# Patient Record
Sex: Male | Born: 1960 | Race: White | Hispanic: No | Marital: Married | Smoking: Never smoker
Health system: Southern US, Community
[De-identification: ages and names within clinical notes are randomized; demographics above are authoritative.]

## PROBLEM LIST (undated history)

## (undated) DIAGNOSIS — F329 Major depressive disorder, single episode, unspecified: Secondary | ICD-10-CM

## (undated) DIAGNOSIS — J45909 Unspecified asthma, uncomplicated: Secondary | ICD-10-CM

## (undated) DIAGNOSIS — K5792 Diverticulitis of intestine, part unspecified, without perforation or abscess without bleeding: Secondary | ICD-10-CM

## (undated) DIAGNOSIS — C44310 Basal cell carcinoma of skin of unspecified parts of face: Secondary | ICD-10-CM

## (undated) DIAGNOSIS — K219 Gastro-esophageal reflux disease without esophagitis: Secondary | ICD-10-CM

## (undated) DIAGNOSIS — F32A Depression, unspecified: Secondary | ICD-10-CM

## (undated) DIAGNOSIS — N400 Enlarged prostate without lower urinary tract symptoms: Secondary | ICD-10-CM

## (undated) DIAGNOSIS — F5102 Adjustment insomnia: Secondary | ICD-10-CM

## (undated) HISTORY — PX: CATARACT EXTRACTION, BILATERAL: SHX1313

## (undated) HISTORY — PX: SHOULDER SURGERY: SHX246

## (undated) HISTORY — DX: Basal cell carcinoma of skin of unspecified parts of face: C44.310

## (undated) HISTORY — DX: Depression, unspecified: F32.A

## (undated) HISTORY — DX: Adjustment insomnia: F51.02

## (undated) HISTORY — DX: Unspecified asthma, uncomplicated: J45.909

## (undated) HISTORY — PX: ABDOMINAL SURGERY: SHX537

---
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
## 2006-10-19 ENCOUNTER — Ambulatory Visit: Payer: Self-pay | Admitting: Internal Medicine

## 2006-10-19 LAB — CONVERTED CEMR LAB
ALT: 25 units/L (ref 0–40)
Albumin: 4 g/dL (ref 3.5–5.2)
Alkaline Phosphatase: 48 units/L (ref 39–117)
BUN: 10 mg/dL (ref 6–23)
Basophils Relative: 0.4 % (ref 0.0–1.0)
Bilirubin Urine: NEGATIVE
CO2: 33 meq/L — ABNORMAL HIGH (ref 19–32)
Calcium: 9.3 mg/dL (ref 8.4–10.5)
Eosinophils Absolute: 0.2 10*3/uL (ref 0.0–0.6)
GFR calc Af Amer: 104 mL/min
GFR calc non Af Amer: 86 mL/min
Lymphocytes Relative: 36.1 % (ref 12.0–46.0)
Monocytes Relative: 7.5 % (ref 3.0–11.0)
Neutro Abs: 3.5 10*3/uL (ref 1.4–7.7)
Platelets: 258 10*3/uL (ref 150–400)
Specific Gravity, Urine: 1.02 (ref 1.000–1.03)
Total CHOL/HDL Ratio: 4.8
Total Protein, Urine: NEGATIVE mg/dL
Triglycerides: 86 mg/dL (ref 0–149)
Urine Glucose: NEGATIVE mg/dL
VLDL: 17 mg/dL (ref 0–40)

## 2006-10-24 ENCOUNTER — Ambulatory Visit: Payer: Self-pay | Admitting: Internal Medicine

## 2006-12-07 ENCOUNTER — Ambulatory Visit: Payer: Self-pay | Admitting: Internal Medicine

## 2007-10-01 ENCOUNTER — Encounter: Payer: Self-pay | Admitting: Internal Medicine

## 2007-11-14 ENCOUNTER — Telehealth: Payer: Self-pay | Admitting: Internal Medicine

## 2007-11-18 ENCOUNTER — Telehealth: Payer: Self-pay | Admitting: Internal Medicine

## 2008-03-11 ENCOUNTER — Telehealth: Payer: Self-pay | Admitting: Family Medicine

## 2008-03-12 ENCOUNTER — Ambulatory Visit: Payer: Self-pay | Admitting: Internal Medicine

## 2008-03-12 ENCOUNTER — Telehealth: Payer: Self-pay | Admitting: Internal Medicine

## 2008-03-12 DIAGNOSIS — K5130 Ulcerative (chronic) rectosigmoiditis without complications: Secondary | ICD-10-CM

## 2008-03-13 ENCOUNTER — Ambulatory Visit: Payer: Self-pay | Admitting: Gastroenterology

## 2008-03-13 DIAGNOSIS — K219 Gastro-esophageal reflux disease without esophagitis: Secondary | ICD-10-CM | POA: Insufficient documentation

## 2008-03-20 ENCOUNTER — Ambulatory Visit: Payer: Self-pay | Admitting: Gastroenterology

## 2008-03-20 ENCOUNTER — Encounter: Payer: Self-pay | Admitting: Gastroenterology

## 2008-03-25 ENCOUNTER — Encounter: Payer: Self-pay | Admitting: Gastroenterology

## 2008-04-23 ENCOUNTER — Telehealth: Payer: Self-pay | Admitting: Internal Medicine

## 2008-09-01 ENCOUNTER — Encounter: Payer: Self-pay | Admitting: Internal Medicine

## 2008-09-11 ENCOUNTER — Telehealth: Payer: Self-pay | Admitting: Internal Medicine

## 2009-10-21 ENCOUNTER — Telehealth: Payer: Self-pay | Admitting: Internal Medicine

## 2010-10-04 NOTE — Progress Notes (Signed)
Summary: REFILLS / Mail order  Phone Note Refill Request   Refills Requested: Medication #1:  NEXIUM 40 MG  CPDR take 1 by mouth qd  Medication #2:  TRAZODONE HCL 50 MG  TABS take 1 by mouth qhs  Medication #3:  UROXATRAL 10 MG  TB24 1 at bedtime Req w/3rfs to go to [facility] OK?   Initial call taken by: Lamar Sprinkles, CMA,  October 21, 2009 10:49 AM  Follow-up for Phone Call        ok  for 3 month Rx's Follow-up by: Jacques Navy MD,  October 21, 2009 1:17 PM    Prescriptions: UROXATRAL 10 MG  TB24 (ALFUZOSIN HCL) 1 at bedtime  #90 x 3   Entered by:   Lamar Sprinkles, CMA   Authorized by:   Jacques Navy MD   Signed by:   Lamar Sprinkles, CMA on 10/21/2009   Method used:   Faxed to ...       [facility])             , [place]         Ph: [phone]       Fax: [phone]   RxID:   [id number] TRAZODONE HCL 50 MG  TABS (TRAZODONE HCL) take 1 by mouth qhs  #90 x 3   Entered by:   Lamar Sprinkles, CMA   Authorized by:   Jacques Navy MD   Signed by:   Lamar Sprinkles, CMA on 10/21/2009   Method used:   Faxed to ...       [facility])             , [place]         Ph: [phone]       Fax: [phone]   RxID:   [id number] NEXIUM 40 MG  CPDR (ESOMEPRAZOLE MAGNESIUM) take 1 by mouth qd  #90 x 3   Entered by:   Lamar Sprinkles, CMA   Authorized by:   Jacques Navy MD   Signed by:   Lamar Sprinkles, CMA on 10/21/2009   Method used:   Faxed to ...       [facility])             , [place]         Ph: [phone]       Fax: [phone]   RxID:   [id number]

## 2010-10-05 ENCOUNTER — Other Ambulatory Visit: Payer: Self-pay | Admitting: Dermatology

## 2010-12-13 ENCOUNTER — Telehealth: Payer: Self-pay | Admitting: Internal Medicine

## 2010-12-13 NOTE — Telephone Encounter (Signed)
Ok to refill for 3 months to mail order. Will need OV before any additional refills

## 2010-12-13 NOTE — Telephone Encounter (Signed)
Refill request [3 medications] per Pt's mother to [facility] - caller states Pt is out of meds - last OV 10/2009. Please advise.

## 2010-12-14 MED ORDER — ESOMEPRAZOLE MAGNESIUM 40 MG PO CPDR: 40.0000 mg | DELAYED_RELEASE_CAPSULE | Freq: Every day | ORAL | Status: DC

## 2010-12-14 MED ORDER — TRAZODONE HCL 50 MG PO TABS: 50.0000 mg | ORAL_TABLET | Freq: Every day | ORAL | Status: DC

## 2010-12-14 MED ORDER — ALFUZOSIN HCL ER 10 MG PO TB24: 10.0000 mg | ORAL_TABLET | Freq: Every day | ORAL | Status: DC

## 2010-12-14 NOTE — Telephone Encounter (Signed)
Done

## 2011-01-20 NOTE — Assessment & Plan Note (Signed)
Edward Hoover                           PRIMARY CARE OFFICE NOTE   Edward Hoover, Edward Hoover                     MRN:          [id number]  DATE:10/24/2006                            DOB:          07-Jul-1961    Edward Hoover is a 50 year old Caucasian gentleman who presents to  establish for ongoing continuity care. He reports he feels well, is  doing well. He has not had a physical exam since mustering out of the  [employer] 5 years ago.   PAST MEDICAL HISTORY:   SURGICAL:  1. Repair of left Achilles tendon.  2. Right shoulder surgery.  3. Laparotomy after major trauma for a repair of a fractured spleen,      lacerated liver, and internal injuries at age 30.   MEDICAL:  Patient had usual childhood diseases and is fully immunized.  Patient has history of GERD, underwent EGD and colonoscopy in 2001, and  had been on proton pump inhibitors in the past.   CURRENT MEDICATIONS:  OTC Pepcid only.   Patient has no known drug allergies.   FAMILY HISTORY:  Father with hypertension, mother with hypertension. No  family history for colon cancer, breast cancer, lung cancer, prostate  cancer, no heart disease.   SOCIAL HISTORY:  Patient completed 2 years of college. 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 Patient has been married for 15 years and his marriage  is in good health.  He has a son he is 24, a daughter who is 46. The  family is healthy and doing well.   HABITS:  Alcohol:  averaging 3 ounces per week. Tobacco:  Patient quit  smoking September 16, 2006 after a significant smoking history.   REVIEW OF SYSTEMS:  Patient has had a 12 pound weight gain. He has  significant insomnia with problems with perseveration. Patient has not  had an eye exam for more than 2 years and frequently has bloodshot  eyes.  No ENT, cardiovascular,  respiratory complaints. He does have  occasional  heartburn treated with OTC Zantac. Patient does have urinary frequency  with nocturia times 2 to 3. No musculoskeletal, dermatologic, or  psychiatric complaints.   PHYSICAL EXAMINATION:  Temperature was 97.1, blood pressure 138/84,  pulse was 70, weight 180.  GENERAL APPEARANCE: This is an athletic appearing, Caucasian gentleman  in no acute distress.  HEENT EXAM: Normocephalic, atraumatic, EACs and TMs were unremarkable,  oropharynx  with native dentition in good repair, no buccal or palatal  lesions were noted, posterior pharynx was clear. Conjunctivae and sclera  were clear. PERRLA, EOMI.  Funduscopic exam was unremarkable.  NECK: Supple without thyromegaly.  NODES: No adenopathy was noted in the cervical or supraclavicular  region.  CHEST: No CVA tenderness.  LUNGS: Clear to auscultation  and percussion.  CARDIOVASCULAR EXAM: 2+ radial pulse, no JVD, no carotid bruits. He had  a quiet precordium with regular rate and rhythm without murmurs, rubs,  or gallops.  ABDOMEN:  Patient has ventral surgical scars that are well-healed, he had  no organosplenomegaly, no guarding, or rebound, no tenderness, no  masses.  GENITALIA: Normal male phallus, bilaterally descended testicles, normal  scrotal contents.  RECTAL EXAM: Normal sphincter tone was noted.  The prostate was smooth,  round, normal in size and contour although I would say it is generous.  EXTREMITIES: Without clubbing, cyanosis, edema, or deformity.  NEUROLOGIC EXAM: Nonfocal.   DATA BASE:  Hemoglobin 15.6 grams, white count was 6,500 with a normal  differential.  Chemistries revealed a glucose of 115.  Electrolytes were  normal.  Kidney function normal with a creatinine of 1 and a GFR of 86.  Liver functions were normal, cholesterol is 187, triglycerides 86, HDL  was 38.8, LDL 131.  Thyroid function normal with a TSH of 1.03.  Urinalysis was negative.   ASSESSMENT/PLAN:   1. Insomnia.  Patient with significant insomnia related to      perseveration with difficultly quieting his thoughts. Plan:  Trial      of trazodone 50 mg nightly.  2. Genitourinary.  Patient with nocturia with frequency with decreased      urinary force of stream, question of postvoid dribble. Suspect the      patient with BPH with partial bladder outlet obstruction. Plan:      Trial of Flomax 0.4 mg nightly 7 days, provided prescription to      fill if improvement.  3. Labs.  Patient with a  borderline LDL, borderline serum glucose. I      have advised the patient to increase his exercise quotient, to be      prudent in his diet in regards to carbohydrates and fats, and I      suspect this will resolve his problem.   In summary, he is a very pleasant gentleman who seems to be medically  stable. I have asked him to report back to me in regards to the response  to trazodone and response to Flomax. He  has asked to return to see me for a physical exam in 3 years. He will  return otherwise on a p.r.n. basis.     Edward Gess Norins, MD  Electronically Signed    MEN/MedQ  DD: 10/25/2006  DT: 10/25/2006  Job #: [id number]   cc:   Dani Gobble

## 2014-10-08 ENCOUNTER — Other Ambulatory Visit: Payer: Self-pay | Admitting: Dermatology

## 2015-01-01 ENCOUNTER — Encounter: Payer: Self-pay | Admitting: Gastroenterology

## 2015-09-05 DIAGNOSIS — K56609 Unspecified intestinal obstruction, unspecified as to partial versus complete obstruction: Secondary | ICD-10-CM

## 2015-09-05 HISTORY — DX: Unspecified intestinal obstruction, unspecified as to partial versus complete obstruction: K56.609

## 2015-10-01 ENCOUNTER — Emergency Department (HOSPITAL_BASED_OUTPATIENT_CLINIC_OR_DEPARTMENT_OTHER)
Admission: EM | Admit: 2015-10-01 | Discharge: 2015-10-01 | Disposition: A | Discharge status: Home / Self Care | Attending: Emergency Medicine | Admitting: Emergency Medicine

## 2015-10-01 ENCOUNTER — Emergency Department (HOSPITAL_BASED_OUTPATIENT_CLINIC_OR_DEPARTMENT_OTHER)

## 2015-10-01 ENCOUNTER — Encounter (HOSPITAL_BASED_OUTPATIENT_CLINIC_OR_DEPARTMENT_OTHER): Payer: Self-pay | Admitting: *Deleted

## 2015-10-01 DIAGNOSIS — K5732 Diverticulitis of large intestine without perforation or abscess without bleeding: Secondary | ICD-10-CM | POA: Diagnosis not present

## 2015-10-01 DIAGNOSIS — Z87438 Personal history of other diseases of male genital organs: Secondary | ICD-10-CM | POA: Diagnosis not present

## 2015-10-01 DIAGNOSIS — Z79899 Other long term (current) drug therapy: Secondary | ICD-10-CM | POA: Insufficient documentation

## 2015-10-01 DIAGNOSIS — Z87828 Personal history of other (healed) physical injury and trauma: Secondary | ICD-10-CM | POA: Diagnosis not present

## 2015-10-01 DIAGNOSIS — R1030 Lower abdominal pain, unspecified: Secondary | ICD-10-CM | POA: Diagnosis present

## 2015-10-01 DIAGNOSIS — K219 Gastro-esophageal reflux disease without esophagitis: Secondary | ICD-10-CM | POA: Diagnosis not present

## 2015-10-01 DIAGNOSIS — Z9889 Other specified postprocedural states: Secondary | ICD-10-CM | POA: Insufficient documentation

## 2015-10-01 HISTORY — DX: Gastro-esophageal reflux disease without esophagitis: K21.9

## 2015-10-01 HISTORY — DX: Benign prostatic hyperplasia without lower urinary tract symptoms: N40.0

## 2015-10-01 HISTORY — DX: Diverticulitis of intestine, part unspecified, without perforation or abscess without bleeding: K57.92

## 2015-10-01 LAB — URINALYSIS, ROUTINE W REFLEX MICROSCOPIC
Bilirubin Urine: NEGATIVE
GLUCOSE, UA: NEGATIVE mg/dL
Hgb urine dipstick: NEGATIVE
Ketones, ur: 15 mg/dL — AB
LEUKOCYTES UA: NEGATIVE
Nitrite: NEGATIVE
PH: 5.5 (ref 5.0–8.0)
Protein, ur: NEGATIVE mg/dL
Specific Gravity, Urine: 1.019 (ref 1.005–1.030)

## 2015-10-01 LAB — CBC WITH DIFFERENTIAL/PLATELET
BASOS ABS: 0 10*3/uL (ref 0.0–0.1)
BASOS PCT: 0 %
Eosinophils Absolute: 0 10*3/uL (ref 0.0–0.7)
Eosinophils Relative: 0 %
HEMATOCRIT: 40 % (ref 39.0–52.0)
Hemoglobin: 13.6 g/dL (ref 13.0–17.0)
LYMPHS PCT: 14 %
Lymphs Abs: 1.9 10*3/uL (ref 0.7–4.0)
MCH: 32.6 pg (ref 26.0–34.0)
MCHC: 34 g/dL (ref 30.0–36.0)
MCV: 95.9 fL (ref 78.0–100.0)
MONO ABS: 1 10*3/uL (ref 0.1–1.0)
Monocytes Relative: 7 %
NEUTROS ABS: 10.4 10*3/uL — AB (ref 1.7–7.7)
NEUTROS PCT: 79 %
Platelets: 227 10*3/uL (ref 150–400)
RBC: 4.17 MIL/uL — AB (ref 4.22–5.81)
RDW: 11 % — AB (ref 11.5–15.5)
WBC: 13.2 10*3/uL — AB (ref 4.0–10.5)

## 2015-10-01 LAB — COMPREHENSIVE METABOLIC PANEL
ALBUMIN: 4.1 g/dL (ref 3.5–5.0)
ALT: 20 U/L (ref 17–63)
AST: 17 U/L (ref 15–41)
Alkaline Phosphatase: 42 U/L (ref 38–126)
Anion gap: 9 (ref 5–15)
BILIRUBIN TOTAL: 1.2 mg/dL (ref 0.3–1.2)
BUN: 13 mg/dL (ref 6–20)
CHLORIDE: 100 mmol/L — AB (ref 101–111)
CO2: 28 mmol/L (ref 22–32)
Calcium: 9 mg/dL (ref 8.9–10.3)
Creatinine, Ser: 0.91 mg/dL (ref 0.61–1.24)
GFR calc Af Amer: >60 mL/min (ref >60)
GFR calc non Af Amer: >60 mL/min (ref >60)
GLUCOSE: 94 mg/dL (ref 65–99)
POTASSIUM: 3.6 mmol/L (ref 3.5–5.1)
Sodium: 137 mmol/L (ref 135–145)
Total Protein: 7 g/dL (ref 6.5–8.1)

## 2015-10-01 LAB — LIPASE, BLOOD: Lipase: 19 U/L (ref 11–51)

## 2015-10-01 MED ORDER — CIPROFLOXACIN HCL 500 MG PO TABS: 500.0000 mg | ORAL_TABLET | Freq: Two times a day (BID) | ORAL | Status: DC

## 2015-10-01 MED ORDER — FENTANYL CITRATE (PF) 100 MCG/2ML IJ SOLN
50.0000 ug | Freq: Once | INTRAMUSCULAR | Status: AC
  Administered 2015-10-01: 50 ug via INTRAVENOUS
  Filled 2015-10-01: qty 2

## 2015-10-01 MED ORDER — CIPROFLOXACIN HCL 500 MG PO TABS
500.0000 mg | ORAL_TABLET | Freq: Once | ORAL | Status: AC
  Administered 2015-10-01: 500 mg via ORAL
  Filled 2015-10-01: qty 1

## 2015-10-01 MED ORDER — TRAMADOL HCL 50 MG PO TABS: 50.0000 mg | ORAL_TABLET | Freq: Four times a day (QID) | ORAL | Status: DC | PRN

## 2015-10-01 MED ORDER — SODIUM CHLORIDE 0.9 % IV BOLUS (SEPSIS)
1000.0000 mL | Freq: Once | INTRAVENOUS | Status: AC
  Administered 2015-10-01: 1000 mL via INTRAVENOUS

## 2015-10-01 MED ORDER — ONDANSETRON HCL 4 MG/2ML IJ SOLN
4.0000 mg | Freq: Once | INTRAMUSCULAR | Status: AC
  Administered 2015-10-01: 4 mg via INTRAVENOUS
  Filled 2015-10-01: qty 2

## 2015-10-01 MED ORDER — METRONIDAZOLE 500 MG PO TABS
500.0000 mg | ORAL_TABLET | Freq: Once | ORAL | Status: AC
  Administered 2015-10-01: 500 mg via ORAL
  Filled 2015-10-01: qty 1

## 2015-10-01 MED ORDER — METRONIDAZOLE 500 MG PO TABS: 500.0000 mg | ORAL_TABLET | Freq: Three times a day (TID) | ORAL | Status: DC

## 2015-10-01 MED FILL — metroNIDAZOLE 500 MG TABS: 500 | 7 days supply | Qty: 21 | Fill #0

## 2015-10-01 MED FILL — traMADol HCL 50 MG TABS: 50 | 4 days supply | Qty: 15 | Fill #0

## 2015-10-01 MED FILL — CIPROFLOXACIN HCL 500 MG TA: 500 | 7 days supply | Qty: 14 | Fill #0

## 2015-10-01 NOTE — ED Provider Notes (Signed)
CSN: [id number]     Arrival date & time 10/01/15  1439 History   First MD Initiated Contact with Patient 10/01/15 1503     Chief Complaint  Patient presents with  . Abdominal Pain     (Consider location/radiation/quality/duration/timing/severity/associated sxs/prior Treatment) HPI Comments: Patient presents with abdominal pain. He states since yesterday he's had markedly worsening pain across his lower abdomen. He states that the pain is crampy, sharp and constant. He denies any nausea or vomiting. He has not had any bowel movements in the last 2 days but does not feel constipated. He states the pain in his abdomen gets worse when he strains to urinate although he doesn't have any actual pain on urination. There is no known fevers. He does have a past history of diverticulitis and says this feels somewhat similar. He also has a past history of abdominal trauma at age 74. He had surgery at that time but is not sure what kind of surgery he ended up having. He did have a CT scan for his last episode of diverticulitis which showed that he had only one functioning kidney. Other kidney was atrophic. 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 He's not taking anything at home for the pain.  Patient is a 55 y.o. male presenting with abdominal pain.  Abdominal Pain Associated symptoms: no chest pain, no chills, no cough, no diarrhea, no fatigue, no fever, no hematuria, no nausea, no shortness of breath and no vomiting     Past Medical History  Diagnosis Date  . Diverticulitis   . Enlarged prostate   . GERD (gastroesophageal reflux disease)    Past Surgical History  Procedure Laterality Date  . Abdominal surgery     No family history on file. Social History  Substance Use Topics  . Smoking status: Never Smoker   . Smokeless tobacco: None  . Alcohol Use: No    Review of Systems  Constitutional: Negative for fever, chills, diaphoresis and fatigue.  HENT: Negative for congestion,  rhinorrhea and sneezing.   Eyes: Negative.   Respiratory: Negative for cough, chest tightness and shortness of breath.   Cardiovascular: Negative for chest pain and leg swelling.  Gastrointestinal: Positive for abdominal pain. Negative for nausea, vomiting, diarrhea and blood in stool.  Genitourinary: Negative for frequency, hematuria, flank pain and difficulty urinating.  Musculoskeletal: Negative for back pain and arthralgias.  Skin: Negative for rash.  Neurological: Negative for dizziness, speech difficulty, weakness, numbness and headaches.      Allergies  Nsaids  Home Medications   Prior to Admission medications   Medication Sig Start Date End Date Taking? Authorizing Provider  Dexlansoprazole (DEXILANT PO) Take by mouth.   Yes Historical Provider, MD  tamsulosin (FLOMAX) 0.4 MG CAPS capsule Take 0.4 mg by mouth.   Yes Historical Provider, MD  alfuzosin (UROXATRAL) 10 MG 24 hr tablet Take 1 tablet (10 mg total) by mouth at bedtime. 12/14/10   Neena Rhymes, MD  ciprofloxacin (CIPRO) 500 MG tablet Take 1 tablet (500 mg total) by mouth 2 (two) times daily. One po bid x 7 days 10/01/15   Malvin Johns, MD  esomeprazole (NEXIUM) 40 MG capsule Take 1 capsule (40 mg total) by mouth daily before breakfast. 12/14/10   Neena Rhymes, MD  metroNIDAZOLE (FLAGYL) 500 MG tablet Take 1 tablet (500 mg total) by mouth 3 (three) times daily. One po bid x 7 days 10/01/15   Malvin Johns, MD  traMADol (ULTRAM) 50 MG tablet  Take 1 tablet (50 mg total) by mouth every 6 (six) hours as needed. 10/01/15   Malvin Johns, MD  traZODone (DESYREL) 50 MG tablet Take 1 tablet (50 mg total) by mouth at bedtime. 12/14/10   Neena Rhymes, MD   BP 133/77 mmHg  Pulse 82  Temp(Src) 98.3 F (36.8 C) (Oral)  Resp 18  Ht 5\' 11"  (1.803 m)  Wt 178 lb (80.74 kg)  BMI 24.84 kg/m2  SpO2 100% Physical Exam  Constitutional: He is oriented to person, place, and time. He appears well-developed and well-nourished.   HENT:  Head: Normocephalic and atraumatic.  Eyes: Pupils are equal, round, and reactive to light.  Neck: Normal range of motion. Neck supple.  Cardiovascular: Normal rate, regular rhythm and normal heart sounds.   Pulmonary/Chest: Effort normal and breath sounds normal. No respiratory distress. He has no wheezes. He has no rales. He exhibits no tenderness.  Abdominal: Soft. Bowel sounds are normal. There is tenderness (moderate TTP across both quadrants of lower abdomen with voluntary guarding). There is no rebound and no guarding.  Musculoskeletal: Normal range of motion. He exhibits no edema.  Lymphadenopathy:    He has no cervical adenopathy.  Neurological: He is alert and oriented to person, place, and time.  Skin: Skin is warm and dry. No rash noted.  Psychiatric: He has a normal mood and affect.    ED Course  Procedures (including critical care time) Labs Review Labs Reviewed  URINALYSIS, ROUTINE W REFLEX MICROSCOPIC (NOT AT [facility]) - Abnormal; Notable for the following:    Ketones, ur 15 (*)    All other components within normal limits  CBC WITH DIFFERENTIAL/PLATELET - Abnormal; Notable for the following:    WBC 13.2 (*)    RBC 4.17 (*)    RDW 11.0 (*)    Neutro Abs 10.4 (*)    All other components within normal limits  COMPREHENSIVE METABOLIC PANEL - Abnormal; Notable for the following:    Chloride 100 (*)    All other components within normal limits  LIPASE, BLOOD    Imaging Review Ct Abdomen Pelvis Wo Contrast  10/01/2015  CLINICAL DATA:  Abdominal pain, history of diverticulitis EXAM: CT ABDOMEN AND PELVIS WITHOUT CONTRAST TECHNIQUE: Multidetector CT imaging of the abdomen and pelvis was performed following the standard protocol without IV contrast. COMPARISON:  03/25/2015 at [place]: Lung bases are unremarkable. Sagittal images of the spine shows minimal degenerative changes lower thoracic and lumbar spine. There is Schmorl's node deformity upper  endplate of L3 vertebral body. Unenhanced liver shows no biliary ductal dilatation. Low-density lesion probable cyst in right hepatic lobe inferior medially measures about 1.7 cm. No calcified gallstones are noted within gallbladder. The visualized proximal pancreas is unremarkable. The body and tail of the pancreas are again not identified. Again noted markedly atrophic nonfunctioning right kidney. The left kidney shows no nephrolithiasis. No left hydronephrosis or left hydroureter. The urinary bladder is under distended. No calcified calculi are noted within urinary bladder. No small bowel obstruction. Normal appendix is partially visualized in axial image 62. The terminal ileum is unremarkable. Moderate stool noted in right colon and cecum. Some colonic stool and gas noted in transverse colon. The left colon is nondistended. Again noted scattered diverticula in sigmoid colon. There is new focus of acute diverticulitis in mid sigmoid colon. There is mild stranding of pericolonic fat just anterior to sigmoid colon please see axial image 67. No evidence of diverticular abscess or definite extraluminal air to  suggest perforation. Again noted mild enlarged prostate gland. No inguinal adenopathy. No destructive bony lesions are noted within pelvis. IMPRESSION: 1. There is mild acute diverticulitis in mid sigmoid colon axial image 67. Mild stranding of anterior pericolonic fat. No evidence of definite perforation. No evidence of diverticular abscess. 2. Again noted markedly atrophic nonfunctional right kidney. Stable appearance of pancreatic head region with non visualization of pancreatic body and tail. 3. Stable cyst in right hepatic lobe inferomedially. 4. No small bowel obstruction. Moderate stool noted in proximal colon. No pericecal inflammation. Normal appendix. 5. Again noted mild enlarged prostate gland. These results were called by telephone at the time of interpretation on 10/01/2015 at 4:10 pm to Dr. Threasa Beards  Kedarius Aloisi , who verbally acknowledged these results. Electronically Signed   By: Lahoma Crocker M.D.   On: 10/01/2015 16:11   I have personally reviewed and evaluated these images and lab results as part of my medical decision-making.   EKG Interpretation None      MDM   Final diagnoses:  Diverticulitis of large intestine without perforation or abscess without bleeding    Patient has evidence of diverticulitis. There is no evidence of perforation or abscess. He is well-appearing in the ED with no ongoing vomiting. His vital signs are stable. He was given IV fluids and a dose of antibiotics in the ED. He was discharged with prescription for 7 day course of Cipro and Flagyl. He was also given a prescription for tramadol for pain. He was encouraged to use a clear liquid diet for the next 2 days and slowly progress after that. Return precautions were given.    Malvin Johns, MD 10/01/15 [phone]

## 2015-10-01 NOTE — Discharge Instructions (Signed)
Diverticulitis °Diverticulitis is inflammation or infection of small pouches in your colon that form when you have a condition called diverticulosis. The pouches in your colon are called diverticula. Your colon, or large intestine, is where water is absorbed and stool is formed. °Complications of diverticulitis can include: °· Bleeding. °· Severe infection. °· Severe pain. °· Perforation of your colon. °· Obstruction of your colon. °CAUSES  °Diverticulitis is caused by bacteria. °Diverticulitis happens when stool becomes trapped in diverticula. This allows bacteria to grow in the diverticula, which can lead to inflammation and infection. °RISK FACTORS °People with diverticulosis are at risk for diverticulitis. Eating a diet that does not include enough fiber from fruits and vegetables may make diverticulitis more likely to develop. °SYMPTOMS  °Symptoms of diverticulitis may include: °· Abdominal pain and tenderness. The pain is normally located on the left side of the abdomen, but may occur in other areas. °· Fever and chills. °· Bloating. °· Cramping. °· Nausea. °· Vomiting. °· Constipation. °· Diarrhea. °· Blood in your stool. °DIAGNOSIS  °Your health care provider will ask you about your medical history and do a physical exam. You may need to have tests done because many medical conditions can cause the same symptoms as diverticulitis. Tests may include: °· Blood tests. °· Urine tests. °· Imaging tests of the abdomen, including X-rays and CT scans. °When your condition is under control, your health care provider may recommend that you have a colonoscopy. A colonoscopy can show how severe your diverticula are and whether something else is causing your symptoms. °TREATMENT  °Most cases of diverticulitis are mild and can be treated at home. Treatment may include: °· Taking over-the-counter pain medicines. °· Following a clear liquid diet. °· Taking antibiotic medicines by mouth for 7-10 days. °More severe cases may  be treated at a hospital. Treatment may include: °· Not eating or drinking. °· Taking prescription pain medicine. °· Receiving antibiotic medicines through an IV tube. °· Receiving fluids and nutrition through an IV tube. °· Surgery. °HOME CARE INSTRUCTIONS  °· Follow your health care provider's instructions carefully. °· Follow a full liquid diet or other diet as directed by your health care provider. After your symptoms improve, your health care provider may tell you to change your diet. He or she may recommend you eat a high-fiber diet. Fruits and vegetables are good sources of fiber. Fiber makes it easier to pass stool. °· Take fiber supplements or probiotics as directed by your health care provider. °· Only take medicines as directed by your health care provider. °· Keep all your follow-up appointments. °SEEK MEDICAL CARE IF:  °· Your pain does not improve. °· You have a hard time eating food. °· Your bowel movements do not return to normal. °SEEK IMMEDIATE MEDICAL CARE IF:  °· Your pain becomes worse. °· Your symptoms do not get better. °· Your symptoms suddenly get worse. °· You have a fever. °· You have repeated vomiting. °· You have bloody or black, tarry stools. °MAKE SURE YOU:  °· Understand these instructions. °· Will watch your condition. °· Will get help right away if you are not doing well or get worse. °  °This information is not intended to replace advice given to you by your health care provider. Make sure you discuss any questions you have with your health care provider. °  °Document Released: 05/31/2005 Document Revised: 08/26/2013 Document Reviewed: 07/16/2013 °Elsevier Interactive Patient Education ©2016 [employer] ° °

## 2015-10-01 NOTE — ED Notes (Signed)
Abdominal pain. Hx of diverticulitis.

## 2015-10-04 ENCOUNTER — Telehealth (HOSPITAL_BASED_OUTPATIENT_CLINIC_OR_DEPARTMENT_OTHER): Payer: Self-pay | Admitting: *Deleted

## 2015-10-04 NOTE — ED Notes (Signed)
Pt's wife called with concerns that pt is having diarrhea. Pt was seen 10/01/2015 and diagnosed with diverticulitis and started on antibiotics. Advised to follow up with PCP or return to ED if needed.

## 2015-12-30 DIAGNOSIS — K5732 Diverticulitis of large intestine without perforation or abscess without bleeding: Secondary | ICD-10-CM | POA: Insufficient documentation

## 2016-05-10 ENCOUNTER — Encounter (HOSPITAL_BASED_OUTPATIENT_CLINIC_OR_DEPARTMENT_OTHER): Payer: Self-pay | Admitting: *Deleted

## 2016-05-10 ENCOUNTER — Emergency Department (HOSPITAL_BASED_OUTPATIENT_CLINIC_OR_DEPARTMENT_OTHER)
Admission: EM | Admit: 2016-05-10 | Discharge: 2016-05-10 | Disposition: A | Discharge status: Home / Self Care | Payer: Worker's Compensation | Attending: Emergency Medicine | Admitting: Emergency Medicine

## 2016-05-10 DIAGNOSIS — S46212A Strain of muscle, fascia and tendon of other parts of biceps, left arm, initial encounter: Secondary | ICD-10-CM

## 2016-05-10 DIAGNOSIS — Y929 Unspecified place or not applicable: Secondary | ICD-10-CM | POA: Diagnosis not present

## 2016-05-10 DIAGNOSIS — Y9389 Activity, other specified: Secondary | ICD-10-CM | POA: Insufficient documentation

## 2016-05-10 DIAGNOSIS — Y990 Civilian activity done for income or pay: Secondary | ICD-10-CM | POA: Insufficient documentation

## 2016-05-10 DIAGNOSIS — S46112A Strain of muscle, fascia and tendon of long head of biceps, left arm, initial encounter: Secondary | ICD-10-CM | POA: Diagnosis not present

## 2016-05-10 DIAGNOSIS — X500XXA Overexertion from strenuous movement or load, initial encounter: Secondary | ICD-10-CM | POA: Diagnosis not present

## 2016-05-10 DIAGNOSIS — Z79899 Other long term (current) drug therapy: Secondary | ICD-10-CM | POA: Insufficient documentation

## 2016-05-10 DIAGNOSIS — S4992XA Unspecified injury of left shoulder and upper arm, initial encounter: Secondary | ICD-10-CM | POA: Diagnosis present

## 2016-05-10 NOTE — ED Notes (Signed)
MD at bedside. 

## 2016-05-10 NOTE — ED Notes (Signed)
Pt given instructions as per chart.Verbalizes understanding. No questions.

## 2016-05-10 NOTE — ED Triage Notes (Addendum)
Pt was lifting a dock plate yesterday (Wt [id number]) and now has pain in his left elbow. "Burning sensation in triceps and forearm." Can push/pull ok, but not lift. Left grip <right. States does not need UDS. Tried alternating ice and heat last p.m. With no relief.

## 2016-05-10 NOTE — ED Notes (Signed)
Offered to send pt to xray, but he states he does not feel he needs one. Will wait for EDP/PA to examine.

## 2016-05-10 NOTE — Discharge Instructions (Signed)
Return here as needed.  Follow-up with the orthopedic, Dr. provided.  Ice and heat to the area that is sore.  Tylenol for pain

## 2016-05-12 NOTE — ED Provider Notes (Signed)
[facility] DEPT [facility] Provider Note   CSN: [id number] Arrival date & time: 05/10/16  0855     History   Chief Complaint Chief Complaint  Patient presents with  . Arm Injury    HPI Edward Hoover is a 55 y.o. male.  HPI Patient presents to the emergency department with upper arm pain on the left.  The patient states that he was moving a loading dock gait when he felt a pulling in his upper arm.  He started having pain.  Patient states that when he lifts.  He has increased pain but pushing and pulling are fine.  The result of his head without difficulty, has no decreased strength.  Patient states nothing seems to radiate to his neck.  Patient denies any numbness or weakness in the arm Past Medical History:  Diagnosis Date  . Diverticulitis   . Enlarged prostate   . GERD (gastroesophageal reflux disease)     Patient Active Problem List   Diagnosis Date Noted  . GERD 03/13/2008  . 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 ABDOMINAL SURGERY         Home Medications    Prior to Admission medications   Medication Sig Start Date End Date Taking? Authorizing Provider  alfuzosin (UROXATRAL) 10 MG 24 hr tablet Take 1 tablet (10 mg total) by mouth at bedtime. 12/14/10   Neena Rhymes, MD  ciprofloxacin (CIPRO) 500 MG tablet Take 1 tablet (500 mg total) by mouth 2 (two) times daily. One po bid x 7 days 10/01/15   Malvin Johns, MD  Dexlansoprazole (DEXILANT PO) Take by mouth.    Historical Provider, MD  esomeprazole (NEXIUM) 40 MG capsule Take 1 capsule (40 mg total) by mouth daily before breakfast. 12/14/10   Neena Rhymes, MD  metroNIDAZOLE (FLAGYL) 500 MG tablet Take 1 tablet (500 mg total) by mouth 3 (three) times daily. One po bid x 7 days 10/01/15   Malvin Johns, MD  tamsulosin (FLOMAX) 0.4 MG CAPS capsule Take 0.4 mg by mouth.    Historical Provider, MD  traMADol (ULTRAM) 50 MG tablet Take 1 tablet (50 mg total)  by mouth every 6 (six) hours as needed. 10/01/15   Malvin Johns, MD  traZODone (DESYREL) 50 MG tablet Take 1 tablet (50 mg total) by mouth at bedtime. 12/14/10   Neena Rhymes, MD    Family History History reviewed. No pertinent family history.  Social History Social History  Substance Use Topics  . Smoking status: Never Smoker  . Smokeless tobacco: Current User  . Alcohol use No     Allergies   Nsaids   Review of Systems Review of Systems All other systems negative except as documented in the HPI. All pertinent positives and negatives as reviewed in the HPI.  Physical Exam Updated Vital Signs BP 112/71 (BP Location: Right Arm)   Pulse (!) 54   Temp 98.3 F (36.8 C) (Oral)   Resp 20   Ht 5\' 11"  (1.803 m)   Wt 80.3 kg   SpO2 100%   BMI 24.69 kg/m   Physical Exam  Constitutional: He appears well-developed and well-nourished.  HENT:  Head: Normocephalic and atraumatic.  Eyes: Pupils are equal, round, and reactive to light.  Neck: Normal range of motion. Neck supple.  Cardiovascular: Normal rate and regular rhythm.   Pulmonary/Chest: Effort normal.  Musculoskeletal:       Left elbow: He exhibits normal range of motion,  no swelling and no deformity. Tenderness found. No radial head, no medial epicondyle, no lateral epicondyle and no olecranon process tenderness noted.       Arms: Skin: Skin is warm and dry. No rash noted. No erythema.     ED Treatments / Results  Labs (all labs ordered are listed, but only abnormal results are displayed) Labs Reviewed - No data to display  EKG  EKG Interpretation None       Radiology No results found.  Procedures Procedures (including critical care time)  Medications Ordered in ED Medications - No data to display   Initial Impression / Assessment and Plan / ED Course  I have reviewed the triage vital signs and the nursing notes.  Pertinent labs & imaging results that were available during my care of the  patient were reviewed by me and considered in my medical decision making (see chart for details).  Clinical Course   Patient be treated for biceps muscle strain.  Advised follow-up with orthopedics.  Told to return here as needed.  Ice and heat to the area.  Patient's bicep appears intact.  He mostly anything injured the long head of the biceps  Final Clinical Impressions(s) / ED Diagnoses   Final diagnoses:  Biceps muscle strain, left, initial encounter    New Prescriptions Discharge Medication List as of 05/10/2016 10:53 AM       Dalia Heading, PA-C 05/12/16 1720    Veryl Speak, MD 05/15/16 2052

## 2016-10-21 ENCOUNTER — Encounter (HOSPITAL_COMMUNITY): Payer: Self-pay | Admitting: Emergency Medicine

## 2016-10-21 ENCOUNTER — Emergency Department (HOSPITAL_COMMUNITY)
Admission: EM | Admit: 2016-10-21 | Discharge: 2016-10-21 | Disposition: A | Discharge status: Home / Self Care | Attending: Emergency Medicine | Admitting: Emergency Medicine

## 2016-10-21 DIAGNOSIS — T65891A Toxic effect of other specified substances, accidental (unintentional), initial encounter: Secondary | ICD-10-CM | POA: Diagnosis not present

## 2016-10-21 DIAGNOSIS — Z79899 Other long term (current) drug therapy: Secondary | ICD-10-CM | POA: Insufficient documentation

## 2016-10-21 DIAGNOSIS — H10212 Acute toxic conjunctivitis, left eye: Secondary | ICD-10-CM | POA: Diagnosis not present

## 2016-10-21 DIAGNOSIS — H5712 Ocular pain, left eye: Secondary | ICD-10-CM | POA: Diagnosis present

## 2016-10-21 MED ORDER — TETRACAINE HCL 0.5 % OP SOLN
2.0000 [drp] | Freq: Once | OPHTHALMIC | Status: AC
  Administered 2016-10-21: 2 [drp] via OPHTHALMIC
  Filled 2016-10-21: qty 2

## 2016-10-21 MED ORDER — OXYCODONE-ACETAMINOPHEN 5-325 MG PO TABS
1.0000 | ORAL_TABLET | Freq: Once | ORAL | Status: AC
  Administered 2016-10-21: 1 via ORAL
  Filled 2016-10-21: qty 1

## 2016-10-21 MED ORDER — HYDROCODONE-ACETAMINOPHEN 5-325 MG PO TABS: 1.0000 | ORAL_TABLET | Freq: Four times a day (QID) | ORAL | 0 refills | Status: DC | PRN

## 2016-10-21 MED ORDER — FLUORESCEIN SODIUM 0.6 MG OP STRP
1.0000 | ORAL_STRIP | Freq: Once | OPHTHALMIC | Status: AC
  Administered 2016-10-21: 1 via OPHTHALMIC
  Filled 2016-10-21: qty 1

## 2016-10-21 MED ORDER — ERYTHROMYCIN 5 MG/GM OP OINT
1.0000 "application " | TOPICAL_OINTMENT | Freq: Once | OPHTHALMIC | Status: AC
  Administered 2016-10-21: 1 via OPHTHALMIC
  Filled 2016-10-21: qty 3.5

## 2016-10-21 NOTE — ED Notes (Signed)
Pt's L eye was irrigated using 1 L NS.

## 2016-10-21 NOTE — ED Triage Notes (Signed)
Pt presents to ED after getting "cactus juice" in his eye.  It's been approximately 1 hour.  Patient states it did not bother his initially, so he just wiped his eye.  Pt began to experience burning and called poison control.  Pt states he rinsed his eye for approx 2-3 minutes.  Redness noted to eye, vision intact.

## 2016-10-21 NOTE — ED Notes (Addendum)
Visual acuity of L eye on pocket Rosenbaum is 20/100.  Pt sts normal vision is 20/20, but needs reading glasses.  R eye is currently 20/40.

## 2016-10-21 NOTE — ED Notes (Signed)
Pt states he wears reading glasses at home but does not have them with him for the visual acuity.

## 2016-10-21 NOTE — Discharge Instructions (Signed)
Use the eye ointment in the affected eye three times a day for the next 5 days. Follow up with Dr. Deon Pilling. Return here as needed. Do not drive while taking the narcotic as it will make you sleepy.

## 2016-10-21 NOTE — ED Notes (Signed)
PH falls between 7 and 8.

## 2016-10-21 NOTE — ED Provider Notes (Signed)
[facility] DEPT Provider Note   CSN: [id number] Arrival date & time: 10/21/16  1906  By signing my name below, I, Charolotte Eke, attest that this documentation has been prepared under the direction and in the presence of Debroah Baller, NP. Electronically Signed: Charolotte Eke, Scribe. 10/21/16. 8:17 PM.   History   Chief Complaint Chief Complaint  Patient presents with  . Eye Pain    HPI Edward Hoover is a 56 y.o. male who presents to the Emergency Department complaining of acute onset moderate eye pain s/p cactus juice in eye at 5:45pm today. Cactus has white looking sap. Described as pencil shaped with burgundy leaves. Pt was using pruning shears to cut the cactus when it squirted him in the eye. Pt tried to rinse and irrigate at home without relief. Pt states that it did not bother him initially, so he just wiped his eye. Pt began to experience burning and called poison control. Last tetanus shot is unknown. Pt states that vision is intact.   The history is provided by the patient. No language interpreter was used.  Eye Pain  This is a new problem. The current episode started less than 1 hour ago. The problem occurs constantly. The problem has not changed since onset.Nothing aggravates the symptoms. Nothing relieves the symptoms. He has tried water for the symptoms. The treatment provided no relief.    Past Medical History:  Diagnosis Date  . Diverticulitis   . Enlarged prostate   . GERD (gastroesophageal reflux disease)     Patient Active Problem List   Diagnosis Date Noted  . GERD 03/13/2008  . 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 ABDOMINAL SURGERY         Home Medications    Prior to Admission medications   Medication Sig Start Date End Date Taking? Authorizing Provider  alfuzosin (UROXATRAL) 10 MG 24 hr tablet Take 1 tablet (10 mg total) by mouth at bedtime. 12/14/10   Neena Rhymes, MD    ciprofloxacin (CIPRO) 500 MG tablet Take 1 tablet (500 mg total) by mouth 2 (two) times daily. One po bid x 7 days 10/01/15   Malvin Johns, MD  Dexlansoprazole (DEXILANT PO) Take by mouth.    Historical Provider, MD  esomeprazole (NEXIUM) 40 MG capsule Take 1 capsule (40 mg total) by mouth daily before breakfast. 12/14/10   Neena Rhymes, MD  HYDROcodone-acetaminophen (NORCO) 5-325 MG tablet Take 1 tablet by mouth every 6 (six) hours as needed. 10/21/16   Hope Bunnie Pion, NP  metroNIDAZOLE (FLAGYL) 500 MG tablet Take 1 tablet (500 mg total) by mouth 3 (three) times daily. One po bid x 7 days 10/01/15   Malvin Johns, MD  tamsulosin (FLOMAX) 0.4 MG CAPS capsule Take 0.4 mg by mouth.    Historical Provider, MD  traMADol (ULTRAM) 50 MG tablet Take 1 tablet (50 mg total) by mouth every 6 (six) hours as needed. 10/01/15   Malvin Johns, MD  traZODone (DESYREL) 50 MG tablet Take 1 tablet (50 mg total) by mouth at bedtime. 12/14/10   Neena Rhymes, MD    Family History History reviewed. No pertinent family history.  Social History Social History  Substance Use Topics  . Smoking status: Never Smoker  . Smokeless tobacco: Current User  . Alcohol use No     Allergies   Nsaids   Review of Systems Review of Systems  Constitutional: Negative for fever.  HENT: Negative for  facial swelling.   Eyes: Positive for photophobia, pain, redness and visual disturbance.  Gastrointestinal: Negative for nausea.  Skin: Negative for wound.     Physical Exam Updated Vital Signs BP 122/86   Pulse 65   Temp 97.9 F (36.6 C) (Oral)   Resp 16   Ht 5\' 10"  (1.778 m)   Wt 83 kg   SpO2 100%   BMI 26.26 kg/m   Physical Exam  Constitutional: He appears well-developed and well-nourished. No distress.  HENT:  Head: Normocephalic and atraumatic.  Eyes: EOM are normal. Pupils are equal, round, and reactive to light. Left eye exhibits chemosis. Left conjunctiva is injected.  Slit lamp exam:      The left  eye shows no corneal abrasion, no corneal flare, no corneal ulcer, no foreign body, no hyphema and no fluorescein uptake.  Left sclera with redness, patient complains of burning.   Cardiovascular: Normal rate.   Pulmonary/Chest: Effort normal.  Musculoskeletal: Normal range of motion.  Neurological: He is alert.  Skin: Skin is warm and dry.  Psychiatric: He has a normal mood and affect. His behavior is normal.  Nursing note and vitals reviewed.    ED Treatments / Results   DIAGNOSTIC STUDIES: Oxygen Saturation is 100% on room air, normal by my interpretation.    COORDINATION OF CARE: 8:07 PM Discussed treatment plan with pt at bedside and pt agreed to plan.  Labs (all labs ordered are listed, but only abnormal results are displayed) Labs Reviewed - No data to display  Radiology No results found.  Procedures Irrigation Date/Time: 10/21/2016 8:04 PM Performed by: Ashley Murrain Authorized by: Charlesetta Shanks  Consent: Verbal consent obtained. Risks and benefits: risks, benefits and alternatives were discussed Consent given by: patient Patient understanding: patient states understanding of the procedure being performed Required items: required blood products, implants, devices, and special equipment available Patient identity confirmed: verbally with patient Preparation: Patient was prepped and draped in the usual sterile fashion. Local anesthesia used: yes Anesthesia method: tetracaine Opth. drops.  Anesthesia: Local anesthesia used: yes  Sedation: Patient sedated: no Patient tolerance: Patient tolerated the procedure well with no immediate complications Comments: Left eye was irrigated due to cactus juice being squirted into the eye when being pruned. Irrigated with NSS 1,000 ccs. Patient tolerated the procedure without any immediate problems.     (including critical care time)  Medications Ordered in ED Medications  tetracaine (PONTOCAINE) 0.5 % ophthalmic  solution 2 drop (2 drops Left Eye Given 10/21/16 1942)  fluorescein ophthalmic strip 1 strip (1 strip Left Eye Given 10/21/16 2209)  oxyCODONE-acetaminophen (PERCOCET/ROXICET) 5-325 MG per tablet 1 tablet (1 tablet Oral Given 10/21/16 2143)  erythromycin ophthalmic ointment 1 application (1 application Left Eye Given 10/21/16 2305)     Initial Impression / Assessment and Plan / ED Course  I have reviewed the triage vital signs and the nursing notes.  Final Clinical Impressions(s) / ED Diagnoses  56 y.o. male with pain and burning to left eye s/p exposure to cactus juice while pruning the cactus. Per poison control irrigated the eye with NSS, pain management and observation. After one hour the patient is beginning to feels better with less burning and able to open his eye without severe pain. Will d/c home with Erythromycin Opth Ointment and pain medication. Opth referral given. Return precautions.   Final diagnoses:  Conjunctivitis, chemical, left    New Prescriptions Discharge Medication List as of 10/21/2016 10:58 PM    START taking these  medications   Details  HYDROcodone-acetaminophen (NORCO) 5-325 MG tablet Take 1 tablet by mouth every 6 (six) hours as needed., Starting Sat 10/21/2016, Print       I personally performed the services described in this documentation, which was scribed in my presence. The recorded information has been reviewed and is accurate.     [place], NP 10/22/16 [place], MD 10/25/16 [phone]

## 2018-01-18 ENCOUNTER — Other Ambulatory Visit: Payer: Self-pay

## 2018-01-18 ENCOUNTER — Encounter (HOSPITAL_BASED_OUTPATIENT_CLINIC_OR_DEPARTMENT_OTHER): Payer: Self-pay | Admitting: Emergency Medicine

## 2018-01-18 ENCOUNTER — Emergency Department (HOSPITAL_BASED_OUTPATIENT_CLINIC_OR_DEPARTMENT_OTHER)

## 2018-01-18 ENCOUNTER — Inpatient Hospital Stay (HOSPITAL_BASED_OUTPATIENT_CLINIC_OR_DEPARTMENT_OTHER)
Admission: EM | Admit: 2018-01-18 | Discharge: 2018-01-31 | DRG: 336 | Disposition: A | Discharge status: Home / Self Care | Attending: Surgery | Admitting: Surgery

## 2018-01-18 ENCOUNTER — Inpatient Hospital Stay (HOSPITAL_COMMUNITY)

## 2018-01-18 DIAGNOSIS — E440 Moderate protein-calorie malnutrition: Secondary | ICD-10-CM

## 2018-01-18 DIAGNOSIS — Z886 Allergy status to analgesic agent status: Secondary | ICD-10-CM | POA: Diagnosis not present

## 2018-01-18 DIAGNOSIS — K5730 Diverticulosis of large intestine without perforation or abscess without bleeding: Secondary | ICD-10-CM | POA: Diagnosis present

## 2018-01-18 DIAGNOSIS — E860 Dehydration: Secondary | ICD-10-CM | POA: Diagnosis not present

## 2018-01-18 DIAGNOSIS — K5651 Intestinal adhesions [bands], with partial obstruction: Principal | ICD-10-CM | POA: Diagnosis present

## 2018-01-18 DIAGNOSIS — R070 Pain in throat: Secondary | ICD-10-CM | POA: Diagnosis not present

## 2018-01-18 DIAGNOSIS — Z0189 Encounter for other specified special examinations: Secondary | ICD-10-CM

## 2018-01-18 DIAGNOSIS — R109 Unspecified abdominal pain: Secondary | ICD-10-CM

## 2018-01-18 DIAGNOSIS — K219 Gastro-esophageal reflux disease without esophagitis: Secondary | ICD-10-CM | POA: Diagnosis present

## 2018-01-18 DIAGNOSIS — K567 Ileus, unspecified: Secondary | ICD-10-CM | POA: Diagnosis not present

## 2018-01-18 DIAGNOSIS — K9189 Other postprocedural complications and disorders of digestive system: Secondary | ICD-10-CM | POA: Diagnosis not present

## 2018-01-18 DIAGNOSIS — Z720 Tobacco use: Secondary | ICD-10-CM | POA: Diagnosis not present

## 2018-01-18 DIAGNOSIS — R112 Nausea with vomiting, unspecified: Secondary | ICD-10-CM

## 2018-01-18 DIAGNOSIS — N400 Enlarged prostate without lower urinary tract symptoms: Secondary | ICD-10-CM | POA: Diagnosis not present

## 2018-01-18 DIAGNOSIS — K56609 Unspecified intestinal obstruction, unspecified as to partial versus complete obstruction: Secondary | ICD-10-CM | POA: Diagnosis present

## 2018-01-18 LAB — COMPREHENSIVE METABOLIC PANEL
ALBUMIN: 4.9 g/dL (ref 3.5–5.0)
ALK PHOS: 55 U/L (ref 38–126)
ALT: 22 U/L (ref 17–63)
AST: 22 U/L (ref 15–41)
Anion gap: 13 (ref 5–15)
BUN: 12 mg/dL (ref 6–20)
CHLORIDE: 98 mmol/L — AB (ref 101–111)
CO2: 26 mmol/L (ref 22–32)
CREATININE: 1.21 mg/dL (ref 0.61–1.24)
Calcium: 9.8 mg/dL (ref 8.9–10.3)
GFR calc non Af Amer: >60 mL/min (ref >60)
GLUCOSE: 137 mg/dL — AB (ref 65–99)
Potassium: 3.9 mmol/L (ref 3.5–5.1)
SODIUM: 137 mmol/L (ref 135–145)
Total Bilirubin: 1 mg/dL (ref 0.3–1.2)
Total Protein: 8 g/dL (ref 6.5–8.1)

## 2018-01-18 LAB — CBC WITH DIFFERENTIAL/PLATELET
BASOS ABS: 0 10*3/uL (ref 0.0–0.1)
BASOS PCT: 0 %
EOS ABS: 0 10*3/uL (ref 0.0–0.7)
EOS PCT: 0 %
HCT: 49.6 % (ref 39.0–52.0)
HEMOGLOBIN: 17.9 g/dL — AB (ref 13.0–17.0)
Lymphocytes Relative: 13 %
Lymphs Abs: 1.8 10*3/uL (ref 0.7–4.0)
MCH: 34.5 pg — ABNORMAL HIGH (ref 26.0–34.0)
MCHC: 36.1 g/dL — ABNORMAL HIGH (ref 30.0–36.0)
MCV: 95.6 fL (ref 78.0–100.0)
Monocytes Absolute: 0.7 10*3/uL (ref 0.1–1.0)
Monocytes Relative: 5 %
NEUTROS PCT: 82 %
Neutro Abs: 11.8 10*3/uL — ABNORMAL HIGH (ref 1.7–7.7)
PLATELETS: 251 10*3/uL (ref 150–400)
RBC: 5.19 MIL/uL (ref 4.22–5.81)
RDW: 11.4 % — ABNORMAL LOW (ref 11.5–15.5)
WBC: 14.4 10*3/uL — AB (ref 4.0–10.5)

## 2018-01-18 LAB — URINALYSIS, ROUTINE W REFLEX MICROSCOPIC
GLUCOSE, UA: NEGATIVE mg/dL
Hgb urine dipstick: NEGATIVE
Ketones, ur: 40 mg/dL — AB
LEUKOCYTES UA: NEGATIVE
Nitrite: NEGATIVE
Protein, ur: 30 mg/dL — AB
pH: 5.5 (ref 5.0–8.0)

## 2018-01-18 LAB — URINALYSIS, MICROSCOPIC (REFLEX)

## 2018-01-18 LAB — LIPASE, BLOOD: LIPASE: 22 U/L (ref 11–51)

## 2018-01-18 MED ORDER — MENTHOL 3 MG MT LOZG
1.0000 | LOZENGE | OROMUCOSAL | Status: DC | PRN
  Filled 2018-01-18: qty 9

## 2018-01-18 MED ORDER — PROMETHAZINE HCL 25 MG/ML IJ SOLN
25.0000 mg | Freq: Four times a day (QID) | INTRAMUSCULAR | Status: DC | PRN
  Administered 2018-01-18: 25 mg via INTRAVENOUS
  Filled 2018-01-18: qty 1

## 2018-01-18 MED ORDER — MORPHINE SULFATE (PF) 2 MG/ML IV SOLN
2.0000 mg | INTRAVENOUS | Status: DC | PRN
  Administered 2018-01-18: 4 mg via INTRAVENOUS
  Administered 2018-01-19 – 2018-01-20 (×4): 2 mg via INTRAVENOUS
  Administered 2018-01-20 (×2): 4 mg via INTRAVENOUS
  Administered 2018-01-20 (×2): 2 mg via INTRAVENOUS
  Administered 2018-01-20: 4 mg via INTRAVENOUS
  Administered 2018-01-21 (×7): 2 mg via INTRAVENOUS
  Filled 2018-01-18 (×4): qty 1
  Filled 2018-01-18 (×3): qty 2
  Filled 2018-01-18: qty 1
  Filled 2018-01-18: qty 2
  Filled 2018-01-18 (×3): qty 1
  Filled 2018-01-18: qty 2
  Filled 2018-01-18: qty 1
  Filled 2018-01-18: qty 2
  Filled 2018-01-18 (×2): qty 1

## 2018-01-18 MED ORDER — ALFUZOSIN HCL ER 10 MG PO TB24
10.0000 mg | ORAL_TABLET | Freq: Every day | ORAL | Status: DC
  Administered 2018-01-18 – 2018-01-30 (×4): 10 mg via ORAL
  Filled 2018-01-18 (×13): qty 1

## 2018-01-18 MED ORDER — FAMOTIDINE IN NACL 20-0.9 MG/50ML-% IV SOLN
20.0000 mg | INTRAVENOUS | Status: DC
  Administered 2018-01-18 – 2018-01-26 (×8): 20 mg via INTRAVENOUS
  Filled 2018-01-18 (×8): qty 50

## 2018-01-18 MED ORDER — PHENOL 1.4 % MT LIQD
1.0000 | OROMUCOSAL | Status: DC | PRN
Start: 2018-01-18 — End: 2018-01-30
  Filled 2018-01-18: qty 177

## 2018-01-18 MED ORDER — ONDANSETRON HCL 4 MG/2ML IJ SOLN
4.0000 mg | Freq: Four times a day (QID) | INTRAMUSCULAR | Status: DC | PRN
  Administered 2018-01-18 – 2018-01-25 (×9): 4 mg via INTRAVENOUS
  Filled 2018-01-18 (×11): qty 2

## 2018-01-18 MED ORDER — MORPHINE SULFATE (PF) 4 MG/ML IV SOLN
6.0000 mg | Freq: Once | INTRAVENOUS | Status: AC
  Administered 2018-01-18: 6 mg via INTRAVENOUS
  Filled 2018-01-18: qty 2

## 2018-01-18 MED ORDER — ONDANSETRON HCL 4 MG/2ML IJ SOLN
4.0000 mg | Freq: Once | INTRAMUSCULAR | Status: AC
  Administered 2018-01-18: 4 mg via INTRAVENOUS

## 2018-01-18 MED ORDER — TRAZODONE HCL 100 MG PO TABS
100.0000 mg | ORAL_TABLET | Freq: Every day | ORAL | Status: DC
  Administered 2018-01-18 – 2018-01-20 (×3): 100 mg via ORAL
  Filled 2018-01-18 (×4): qty 1

## 2018-01-18 MED ORDER — ACETAMINOPHEN 10 MG/ML IV SOLN
1000.0000 mg | Freq: Four times a day (QID) | INTRAVENOUS | Status: AC | PRN
  Filled 2018-01-18: qty 100

## 2018-01-18 MED ORDER — SODIUM CHLORIDE 0.9 % IV BOLUS
1000.0000 mL | Freq: Once | INTRAVENOUS | Status: AC
  Administered 2018-01-18: 1000 mL via INTRAVENOUS

## 2018-01-18 MED ORDER — ONDANSETRON HCL 4 MG/2ML IJ SOLN
INTRAMUSCULAR | Status: AC
  Filled 2018-01-18: qty 2

## 2018-01-18 MED ORDER — HEPARIN SODIUM (PORCINE) 5000 UNIT/ML IJ SOLN
5000.0000 [IU] | Freq: Three times a day (TID) | INTRAMUSCULAR | Status: DC
  Administered 2018-01-18 – 2018-01-31 (×37): 5000 [IU] via SUBCUTANEOUS
  Filled 2018-01-18 (×38): qty 1

## 2018-01-18 MED ORDER — KCL IN DEXTROSE-NACL 20-5-0.45 MEQ/L-%-% IV SOLN
INTRAVENOUS | Status: DC
  Administered 2018-01-18 – 2018-01-21 (×7): via INTRAVENOUS
  Administered 2018-01-21: 125 mL/h via INTRAVENOUS
  Administered 2018-01-21 – 2018-01-26 (×9): via INTRAVENOUS
  Administered 2018-01-26: 125 mL/h via INTRAVENOUS
  Administered 2018-01-27: 06:00:00 via INTRAVENOUS
  Filled 2018-01-18 (×27): qty 1000

## 2018-01-18 MED ORDER — DIATRIZOATE MEGLUMINE & SODIUM 66-10 % PO SOLN
90.0000 mL | Freq: Once | ORAL | Status: AC
  Administered 2018-01-21: 90 mL via NASOGASTRIC
  Filled 2018-01-18 (×2): qty 90

## 2018-01-18 NOTE — ED Notes (Signed)
Handoff report given to Miquel Dunn, Therapist, sports at [employer].

## 2018-01-18 NOTE — ED Notes (Signed)
Patient transported to CT 

## 2018-01-18 NOTE — ED Triage Notes (Signed)
Sine 0300 on 01/17/18 has been having abd cramping and vomiting . Ate chinese food earlier that night .

## 2018-01-18 NOTE — Progress Notes (Signed)
Two attempts at placing NG tube. Claiborne Billings, [place] ordered for radiology guided NG. Spoke with Radiology. Charge nurse will attempt for second nurse attempt.

## 2018-01-18 NOTE — ED Notes (Signed)
Report given to [facility]. ETA 15-20 mins.

## 2018-01-18 NOTE — H&P (Addendum)
[facility] Admission Note  Edward Hoover [birth year]  [id number].    Requesting MD: Reather Converse Chief Complaint/Reason for Consult: SBO  HPI:  Patient is a 57 year old male who presented to [facility] with severe abdominal cramping since 01/17/18. Reported eating chinese food one night prior but no sick contacts. He has had vomiting, no diarrhea. Pain located in central abdomen, grabbing in character, non-radiating. Tried taking some TUMS without relief. Has vomited bilious material, no bloody or coffee-ground emesis. Last BM was yesterday AM, usually has a BM every morining. Patient has never had a bowel obstruction before. Last colonoscopy was in 2017. Hx of abdominal surgery when he was a child for a boat and trailer falling on him, it sounds like he had small bowel resection and a severe liver laceration.  Has a single functioning kidney, the right one is atrophic but kidney function is stable. Allergic to NSAIDs.   CT scan showed sbo in proximal to mid ileum and sigmoid diverticulosis without concern for diverticulitis. WBC slightly elevated at 14.4, but patient appears to be dehydrated and hemoconcentrated. Patient is afebrile.   ROS: Review of Systems  Constitutional: Negative for chills, fever, malaise/fatigue and weight loss.  Respiratory: Negative for shortness of breath.   Cardiovascular: Negative for chest pain and palpitations.  Gastrointestinal: Positive for abdominal pain, nausea and vomiting. Negative for blood in stool, constipation, diarrhea and melena.  Genitourinary: Negative for dysuria, frequency and urgency.  All other systems reviewed and are negative.   No family history on file.  Past Medical History:  Diagnosis Date  . Diverticulitis   . Enlarged prostate   . GERD (gastroesophageal reflux disease)     Past Surgical History:  Procedure Laterality Date  . ABDOMINAL SURGERY      Social History:  reports that he has never smoked. He uses smokeless  tobacco. He reports that he drinks about 1.8 oz of alcohol per week. He reports that he does not use drugs.  Allergies:  Allergies  Allergen Reactions  . Nsaids Anaphylaxis     (Not in a hospital admission)  Blood pressure 125/78, pulse 60, temperature 98.2 F (36.8 C), temperature source Oral, resp. rate 18, height 5' 11"  (1.803 m), weight 81.2 kg (179 lb), SpO2 99 %. Physical Exam: Physical Exam  Constitutional: He is oriented to person, place, and time. He appears well-developed and well-nourished. He is cooperative.  Non-toxic appearance. No distress.  HENT:  Head: Normocephalic and atraumatic.  Right Ear: External ear normal.  Left Ear: External ear normal.  Nose: Nose normal.  Mouth/Throat: Mucous membranes are normal.  Eyes: Pupils are equal, round, and reactive to light. Conjunctivae, EOM and lids are normal. No scleral icterus.  Neck: Normal range of motion and phonation normal. Neck supple.  Cardiovascular: Normal rate and regular rhythm.  Pulses:      Radial pulses are 2+ on the right side, and 2+ on the left side.       Dorsalis pedis pulses are 2+ on the right side, and 2+ on the left side.  Pulmonary/Chest: Effort normal and breath sounds normal.  Abdominal: Soft. He exhibits no distension. Bowel sounds are decreased. There is no hepatosplenomegaly. There is tenderness in the epigastric area and periumbilical area. There is no rigidity, no rebound and no guarding.  Scars from past abdominal surgery present.  Musculoskeletal:  ROM grossly intact in bilateral upper and lower extremities.  Neurological: He is alert and oriented to person, place, and time. He has  normal strength. No sensory deficit.  Skin: Skin is warm, dry and intact.  Psychiatric: He has a normal mood and affect. His speech is normal and behavior is normal.    Results for orders placed or performed during the hospital encounter of 01/18/18 (from the past 48 hour(s))  Urinalysis, Routine w reflex  microscopic     Status: Abnormal   Collection Time: 01/18/18  7:54 AM  Result Value Ref Range   Color, Urine AMBER (A) YELLOW    Comment: BIOCHEMICALS MAY BE AFFECTED BY COLOR   APPearance CLEAR CLEAR   Specific Gravity, Urine >1.030 (H) 1.005 - 1.030   pH 5.5 5.0 - 8.0   Glucose, UA NEGATIVE NEGATIVE mg/dL   Hgb urine dipstick NEGATIVE NEGATIVE   Bilirubin Urine SMALL (A) NEGATIVE   Ketones, ur 40 (A) NEGATIVE mg/dL   Protein, ur 30 (A) NEGATIVE mg/dL   Nitrite NEGATIVE NEGATIVE   Leukocytes, UA NEGATIVE NEGATIVE    Comment: Performed at [facility], [address] [place], [place] [address]  Urinalysis, Microscopic (reflex)     Status: Abnormal   Collection Time: 01/18/18  7:54 AM  Result Value Ref Range   RBC / HPF 0-5 0 - 5 RBC/hpf   WBC, UA 0-5 0 - 5 WBC/hpf   Bacteria, UA MANY (A) NONE SEEN   Squamous Epithelial / LPF 0-5 0 - 5   Mucus PRESENT     Comment: Performed at [facility], [address] [place], [place] [address]  Lipase, blood     Status: None   Collection Time: 01/18/18  8:02 AM  Result Value Ref Range   Lipase 22 11 - 51 U/L    Comment: Performed at [facility], [address] [place], [place] [address]  CBC with Differential     Status: Abnormal   Collection Time: 01/18/18  8:02 AM  Result Value Ref Range   WBC 14.4 (H) 4.0 - 10.5 K/uL   RBC 5.19 4.22 - 5.81 MIL/uL   Hemoglobin 17.9 (H) 13.0 - 17.0 g/dL   HCT 49.6 39.0 - 52.0 %   MCV 95.6 78.0 - 100.0 fL   MCH 34.5 (H) 26.0 - 34.0 pg   MCHC 36.1 (H) 30.0 - 36.0 g/dL   RDW 11.4 (L) 11.5 - 15.5 %   Platelets 251 150 - 400 K/uL   Neutrophils Relative % 82 %   Neutro Abs 11.8 (H) 1.7 - 7.7 K/uL   Lymphocytes Relative 13 %   Lymphs Abs 1.8 0.7 - 4.0 K/uL   Monocytes Relative 5 %   Monocytes Absolute 0.7 0.1 - 1.0 K/uL   Eosinophils Relative 0 %   Eosinophils Absolute 0.0 0.0 - 0.7 K/uL   Basophils Relative 0 %   Basophils Absolute 0.0 0.0 - 0.1 K/uL    Comment:  Performed at [facility], [address] [place], [place] [address]  Comprehensive metabolic panel     Status: Abnormal   Collection Time: 01/18/18  8:02 AM  Result Value Ref Range   Sodium 137 135 - 145 mmol/L   Potassium 3.9 3.5 - 5.1 mmol/L   Chloride 98 (L) 101 - 111 mmol/L   CO2 26 22 - 32 mmol/L   Glucose, Bld 137 (H) 65 - 99 mg/dL   BUN 12 6 - 20 mg/dL   Creatinine, Ser 1.21 0.61 - 1.24 mg/dL   Calcium 9.8 8.9 - 10.3 mg/dL  Total Protein 8.0 6.5 - 8.1 g/dL   Albumin 4.9 3.5 - 5.0 g/dL   AST 22 15 - 41 U/L   ALT 22 17 - 63 U/L   Alkaline Phosphatase 55 38 - 126 U/L   Total Bilirubin 1.0 0.3 - 1.2 mg/dL   GFR calc non Af Amer >60 >60 mL/min   GFR calc Af Amer >60 >60 mL/min    Comment: (NOTE) The eGFR has been calculated using the CKD EPI equation. This calculation has not been validated in all clinical situations. eGFR's persistently <60 mL/min signify possible Chronic Kidney Disease.    Anion gap 13 5 - 15    Comment: Performed at [facility], [place], [place] [address]   Ct Renal Joaquim Lai Study  Result Date: 01/18/2018 CLINICAL DATA:  Abdominal cramping and vomiting. EXAM: CT ABDOMEN AND PELVIS WITHOUT CONTRAST TECHNIQUE: Multidetector CT imaging of the abdomen and pelvis was performed following the standard protocol without IV contrast. COMPARISON:  10/01/2015 FINDINGS: Lower chest: Normal Hepatobiliary: Chronic benign appearing 2.5 cm of low-density right lobe of liver unchanged since the previous exam could be cyst or hemangioma. No calcified gallstones. Pancreas: Pronounced fatty change of the pancreatic tissue. Very little soft tissue density pancreatic tissue visible. Spleen: Normal Adrenals/Urinary Tract: Adrenal glands are normal. Right kidney shows chronic atrophy. Compensatory enlargement of the left kidney without acute finding. Stomach/Bowel: There are some dilated fluid and air-filled loops small intestine consistent with  partial small bowel obstruction. Proximal small bowel and distal small bowel are decompressed. No abnormality of the: Than minimal sigmoid diverticulosis without evidence of diverticulitis. Vascular/Lymphatic: Aortic atherosclerosis. No aneurysm. IVC is normal. No retroperitoneal adenopathy. Reproductive: Normal Other: Small amount of free fluid in the pelvis. Musculoskeletal: Normal IMPRESSION: Small-bowel obstruction, proximal to mid ileum. Small bowel proximal to that and distal to that is decompressed. Small amount of associated intraperitoneal fluid. Specific cause of the obstruction not elucidated. Pronounced fatty change of the pancreatic tissue. Chronic atrophic right kidney with compensatory hypertrophy of the left kidney. Aortic atherosclerosis. Electronically Signed   By: Nelson Chimes M.D.   On: 01/18/2018 09:23      Assessment/Plan GERD - pepcid IV BPH - home meds  SBO - likely secondary to adhesions from past trauma and abdominal surgeries related to that injury - admit  - place NGT for decompression and start SB protocol tonight - no indications for acute operative intervention, hopefully this can resolve without surgery  FEN: NPO, IVF; NGT to LIWS VTE: SCDs, SQ heparin ID: no abx indicated at this time  Brigid Re, [facility] 01/18/2018, 9:51 AM Pager: [phone] Consults: [phone]  Agree with above. He has had no history of a prior bowel obstruction in the past. Plan SB protocol after NGT placed.  His wife and mother are at the bedside.  Alphonsa Overall, MD, [facility] Pager: [phone] Office phone:  [phone]

## 2018-01-18 NOTE — ED Provider Notes (Signed)
[facility] Provider Note   CSN: [id number] Arrival date & time: 01/18/18  0736     History   Chief Complaint Chief Complaint  Patient presents with  . Abdominal Pain    HPI Edward Hoover is a 57 y.o. male.  Patient with history of abdominal surgery as a child however on known details from a trauma, single functioning kidney, diverticulitis presents with severe abdominal cramping since yesterday. Patient did eat [place] food the night prior however no sick. Recurrent vomiting without diarrhea. Pain is worse central abdomen.Patient drinks alcohol daily. No history of pancreatitis however patient was told he only had partial pancreas anatomy on a previous CT scan.     Past Medical History:  Diagnosis Date  . Diverticulitis   . Enlarged prostate   . GERD (gastroesophageal reflux disease)     Patient Active Problem List   Diagnosis Date Noted  . Small bowel obstruction ([place]) 01/18/2018  . GERD 03/13/2008  . 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 ABDOMINAL SURGERY          Home Medications    Prior to Admission medications   Medication Sig Start Date End Date Taking? Authorizing Provider  alfuzosin (UROXATRAL) 10 MG 24 hr tablet Take 1 tablet (10 mg total) by mouth at bedtime. 12/14/10   Norins, Heinz Knuckles, MD  ciprofloxacin (CIPRO) 500 MG tablet Take 1 tablet (500 mg total) by mouth 2 (two) times daily. One po bid x 7 days 10/01/15   Malvin Johns, MD  Dexlansoprazole (DEXILANT PO) Take by mouth.    [provider]  esomeprazole (NEXIUM) 40 MG capsule Take 1 capsule (40 mg total) by mouth daily before breakfast. 12/14/10   Norins, Heinz Knuckles, MD  HYDROcodone-acetaminophen (NORCO) 5-325 MG tablet Take 1 tablet by mouth every 6 (six) hours as needed. 10/21/16   Ashley Murrain, NP  metroNIDAZOLE (FLAGYL) 500 MG tablet Take 1 tablet (500 mg total) by mouth 3 (three) times  daily. One po bid x 7 days 10/01/15   Malvin Johns, MD  tamsulosin (FLOMAX) 0.4 MG CAPS capsule Take 0.4 mg by mouth.    [provider]  traMADol (ULTRAM) 50 MG tablet Take 1 tablet (50 mg total) by mouth every 6 (six) hours as needed. 10/01/15   Malvin Johns, MD  traZODone (DESYREL) 50 MG tablet Take 1 tablet (50 mg total) by mouth at bedtime. 12/14/10   Norins, Heinz Knuckles, MD    Family History No family history on file.  Social History Social History   Tobacco Use  . Smoking status: Never Smoker  . Smokeless tobacco: Current User  Substance Use Topics  . Alcohol use: Yes    Alcohol/week: 1.8 oz    Types: 3 Cans of beer per week    Comment: daily   . Drug use: No     Allergies   Nsaids   Review of Systems Review of Systems  Constitutional: Negative for chills and fever.  HENT: Negative for congestion.   Eyes: Negative for visual disturbance.  Respiratory: Negative for shortness of breath.   Cardiovascular: Negative for chest pain.  Gastrointestinal: Positive for abdominal pain, nausea and vomiting.  Genitourinary: Negative for dysuria and flank pain.  Musculoskeletal: Negative for back pain, neck pain and neck stiffness.  Skin: Negative for rash.  Neurological: Negative for light-headedness and headaches.     Physical Exam Updated Vital Signs BP 125/78 (BP Location: Right  Arm)   Pulse 60   Temp 98.2 F (36.8 C) (Oral)   Resp 18   Ht 5\' 11"  (1.803 m)   Wt 81.2 kg (179 lb)   SpO2 99%   BMI 24.97 kg/m   Physical Exam  Constitutional: He is oriented to person, place, and time. He appears well-developed and well-nourished.  HENT:  Head: Normocephalic and atraumatic.  Eyes: Conjunctivae are normal. Right eye exhibits no discharge. Left eye exhibits no discharge.  Neck: Normal range of motion. Neck supple. No tracheal deviation present.  Cardiovascular: Normal rate and regular rhythm.  Pulmonary/Chest: Effort normal and breath sounds normal.    Abdominal: Soft. He exhibits no distension. There is tenderness (central moderate). There is no guarding.  Musculoskeletal: He exhibits no edema.  Neurological: He is alert and oriented to person, place, and time.  Skin: Skin is warm. No rash noted.  Psychiatric: He has a normal mood and affect.  Nursing note and vitals reviewed.    ED Treatments / Results  Labs (all labs ordered are listed, but only abnormal results are displayed) Labs Reviewed  CBC WITH DIFFERENTIAL/PLATELET - Abnormal; Notable for the following components:      Result Value   WBC 14.4 (*)    Hemoglobin 17.9 (*)    MCH 34.5 (*)    MCHC 36.1 (*)    RDW 11.4 (*)    Neutro Abs 11.8 (*)    All other components within normal limits  COMPREHENSIVE METABOLIC PANEL - Abnormal; Notable for the following components:   Chloride 98 (*)    Glucose, Bld 137 (*)    All other components within normal limits  URINALYSIS, ROUTINE W REFLEX MICROSCOPIC - Abnormal; Notable for the following components:   Color, Urine AMBER (*)    Specific Gravity, Urine >1.030 (*)    Bilirubin Urine SMALL (*)    Ketones, ur 40 (*)    Protein, ur 30 (*)    All other components within normal limits  URINALYSIS, MICROSCOPIC (REFLEX) - Abnormal; Notable for the following components:   Bacteria, UA MANY (*)    All other components within normal limits  LIPASE, BLOOD    EKG None  Radiology Ct Renal Stone Study  Result Date: 01/18/2018 CLINICAL DATA:  Abdominal cramping and vomiting. EXAM: CT ABDOMEN AND PELVIS WITHOUT CONTRAST TECHNIQUE: Multidetector CT imaging of the abdomen and pelvis was performed following the standard protocol without IV contrast. COMPARISON:  10/01/2015 FINDINGS: Lower chest: Normal Hepatobiliary: Chronic benign appearing 2.5 cm of low-density right lobe of liver unchanged since the previous exam could be cyst or hemangioma. No calcified gallstones. Pancreas: Pronounced fatty change of the pancreatic tissue. Very little  soft tissue density pancreatic tissue visible. Spleen: Normal Adrenals/Urinary Tract: Adrenal glands are normal. Right kidney shows chronic atrophy. Compensatory enlargement of the left kidney without acute finding. Stomach/Bowel: There are some dilated fluid and air-filled loops small intestine consistent with partial small bowel obstruction. Proximal small bowel and distal small bowel are decompressed. No abnormality of the: Than minimal sigmoid diverticulosis without evidence of diverticulitis. Vascular/Lymphatic: Aortic atherosclerosis. No aneurysm. IVC is normal. No retroperitoneal adenopathy. Reproductive: Normal Other: Small amount of free fluid in the pelvis. Musculoskeletal: Normal IMPRESSION: Small-bowel obstruction, proximal to mid ileum. Small bowel proximal to that and distal to that is decompressed. Small amount of associated intraperitoneal fluid. Specific cause of the obstruction not elucidated. Pronounced fatty change of the pancreatic tissue. Chronic atrophic right kidney with compensatory hypertrophy of the left kidney. Aortic  atherosclerosis. Electronically Signed   By: Nelson Chimes M.D.   On: 01/18/2018 09:23    Procedures Procedures (including critical care time)  Medications Ordered in ED Medications  ondansetron (ZOFRAN) injection 4 mg (4 mg Intravenous Given 01/18/18 0829)  sodium chloride 0.9 % bolus 1,000 mL (0 mLs Intravenous Stopped 01/18/18 0942)  morphine 4 MG/ML injection 6 mg (6 mg Intravenous Given 01/18/18 0904)     Initial Impression / Assessment and Plan / ED Course  I have reviewed the triage vital signs and the nursing notes.  Pertinent labs & imaging results that were available during my care of the patient were reviewed by me and considered in my medical decision making (see chart for details).    Patient presents with worsening abdominal pain and vomiting discussed broad differential diagnosis. With worsening symptoms we discussed risks of radiation and  decision made patient myself to get CT scan without contrast. Patient only has 1 kidney. IV fluids nausea meds and pain meds given. Pain improved in the ER. Blood work reviewed mild leukocytosis otherwise unremarkable normal lipase. Results and differential diagnosis were discussed with the patient/parent/guardian. Spoke with Gen surgery, accepted for Dr Lucia Gaskins to go to [facility] floor. Pt not vomiting in ED, npo, well appearing on recheck. Xrays were independently reviewed by myself.  Close follow up outpatient was discussed, comfortable with the plan.   Medications  ondansetron (ZOFRAN) injection 4 mg (4 mg Intravenous Given 01/18/18 0829)  sodium chloride 0.9 % bolus 1,000 mL (0 mLs Intravenous Stopped 01/18/18 0942)  morphine 4 MG/ML injection 6 mg (6 mg Intravenous Given 01/18/18 0904)    Vitals:   01/18/18 0745 01/18/18 0908  BP: (!) 146/98 125/78  Pulse: 84 60  Resp: 18 18  Temp: 98.2 F (36.8 C)   TempSrc: Oral   SpO2: 100% 99%  Weight: 81.2 kg (179 lb)   Height: 5\' 11"  (1.803 m)     Final diagnoses:  Central abdominal pain  Non-intractable vomiting with nausea, unspecified vomiting type  Small bowel obstruction (HCC)     Final Clinical Impressions(s) / ED Diagnoses   Final diagnoses:  Central abdominal pain  Non-intractable vomiting with nausea, unspecified vomiting type  Small bowel obstruction [facility])    ED Discharge Orders    None       Elnora Morrison, MD 01/18/18 [phone]

## 2018-01-18 NOTE — ED Notes (Signed)
Last known intake was 6pm on 01/17/2018 as reported by pt.

## 2018-01-18 NOTE — ED Notes (Signed)
[facility] at bedside 

## 2018-01-18 NOTE — ED Notes (Signed)
Pt informed he is NPO at this time. No further needs.

## 2018-01-18 NOTE — Discharge Instructions (Addendum)

## 2018-01-19 ENCOUNTER — Inpatient Hospital Stay (HOSPITAL_COMMUNITY)

## 2018-01-19 LAB — CBC
HEMATOCRIT: 43 % (ref 39.0–52.0)
Hemoglobin: 14.2 g/dL (ref 13.0–17.0)
MCH: 33.2 pg (ref 26.0–34.0)
MCHC: 33 g/dL (ref 30.0–36.0)
MCV: 100.5 fL — ABNORMAL HIGH (ref 78.0–100.0)
PLATELETS: 209 10*3/uL (ref 150–400)
RBC: 4.28 MIL/uL (ref 4.22–5.81)
RDW: 12 % (ref 11.5–15.5)
WBC: 7.4 10*3/uL (ref 4.0–10.5)

## 2018-01-19 LAB — BASIC METABOLIC PANEL
ANION GAP: 7 (ref 5–15)
BUN: 9 mg/dL (ref 6–20)
CO2: 30 mmol/L (ref 22–32)
Calcium: 8.8 mg/dL — ABNORMAL LOW (ref 8.9–10.3)
Chloride: 103 mmol/L (ref 101–111)
Creatinine, Ser: 1.16 mg/dL (ref 0.61–1.24)
GFR calc Af Amer: >60 mL/min (ref >60)
GFR calc non Af Amer: >60 mL/min (ref >60)
Glucose, Bld: 121 mg/dL — ABNORMAL HIGH (ref 65–99)
POTASSIUM: 4.4 mmol/L (ref 3.5–5.1)
Sodium: 140 mmol/L (ref 135–145)

## 2018-01-19 MED ORDER — LORAZEPAM 2 MG/ML IJ SOLN
0.5000 mg | INTRAMUSCULAR | Status: DC | PRN
  Administered 2018-01-19 – 2018-01-29 (×5): 0.5 mg via INTRAVENOUS
  Filled 2018-01-19 (×7): qty 1

## 2018-01-19 MED ORDER — PROMETHAZINE HCL 25 MG/ML IJ SOLN
25.0000 mg | Freq: Four times a day (QID) | INTRAMUSCULAR | Status: DC | PRN
  Administered 2018-01-19 – 2018-01-28 (×24): 25 mg via INTRAVENOUS
  Filled 2018-01-19 (×25): qty 1

## 2018-01-19 NOTE — Progress Notes (Signed)
   Subjective/Chief Complaint: Still with nausea but less abdominal pain and passing flatus several times   Objective: Vital signs in last 24 hours: Temp:  [98 F (36.7 C)-98.2 F (36.8 C)] 98.1 F (36.7 C) (05/18 0611) Pulse Rate:  [51-84] 54 (05/18 0611) Resp:  [16-18] 18 (05/17 1352) BP: (96-146)/(58-98) 96/58 (05/18 0611) SpO2:  [97 %-100 %] 98 % (05/18 0611) Weight:  [81.2 kg (179 lb)] 81.2 kg (179 lb) (05/17 0745) Last BM Date: 01/16/18  Intake/Output from previous day: 05/17 0701 - 05/18 0700 In: 3259.2 [I.V.:2109.2; IV Piggyback:1150] Out: -  Intake/Output this shift: No intake/output data recorded.  Exam: Looks comfortable Abdomen a little full but non-tender  Lab Results:  Recent Labs    01/18/18 0802 01/19/18 0509  WBC 14.4* 7.4  HGB 17.9* 14.2  HCT 49.6 43.0  PLT 251 209   BMET Recent Labs    01/18/18 0802 01/19/18 0509  NA 137 140  K 3.9 4.4  CL 98* 103  CO2 26 30  GLUCOSE 137* 121*  BUN 12 9  CREATININE 1.21 1.16  CALCIUM 9.8 8.8*   PT/INR No results for input(s): LABPROT, INR in the last 72 hours. ABG No results for input(s): PHART, HCO3 in the last 72 hours.  Invalid input(s): PCO2, PO2  Studies/Results: Ct Renal Stone Study  Result Date: 01/18/2018 CLINICAL DATA:  Abdominal cramping and vomiting. EXAM: CT ABDOMEN AND PELVIS WITHOUT CONTRAST TECHNIQUE: Multidetector CT imaging of the abdomen and pelvis was performed following the standard protocol without IV contrast. COMPARISON:  10/01/2015 FINDINGS: Lower chest: Normal Hepatobiliary: Chronic benign appearing 2.5 cm of low-density right lobe of liver unchanged since the previous exam could be cyst or hemangioma. No calcified gallstones. Pancreas: Pronounced fatty change of the pancreatic tissue. Very little soft tissue density pancreatic tissue visible. Spleen: Normal Adrenals/Urinary Tract: Adrenal glands are normal. Right kidney shows chronic atrophy. Compensatory enlargement of the  left kidney without acute finding. Stomach/Bowel: There are some dilated fluid and air-filled loops small intestine consistent with partial small bowel obstruction. Proximal small bowel and distal small bowel are decompressed. No abnormality of the: Than minimal sigmoid diverticulosis without evidence of diverticulitis. Vascular/Lymphatic: Aortic atherosclerosis. No aneurysm. IVC is normal. No retroperitoneal adenopathy. Reproductive: Normal Other: Small amount of free fluid in the pelvis. Musculoskeletal: Normal IMPRESSION: Small-bowel obstruction, proximal to mid ileum. Small bowel proximal to that and distal to that is decompressed. Small amount of associated intraperitoneal fluid. Specific cause of the obstruction not elucidated. Pronounced fatty change of the pancreatic tissue. Chronic atrophic right kidney with compensatory hypertrophy of the left kidney. Aortic atherosclerosis. Electronically Signed   By: Nelson Chimes M.D.   On: 01/18/2018 09:23    Anti-infectives: Anti-infectives (From admission, onward)   None      Assessment/Plan: SBO  Check xrays Continue NPO for now.  If films better today and nausea improves, would try liquids  LOS: 1 day    Shaft Corigliano A 01/19/2018

## 2018-01-20 MED ORDER — BISACODYL 10 MG RE SUPP
10.0000 mg | Freq: Once | RECTAL | Status: AC
  Administered 2018-01-20: 10 mg via RECTAL
  Filled 2018-01-20: qty 1

## 2018-01-20 NOTE — Progress Notes (Signed)
Subjective/Chief Complaint: Minimal abdominal pain today Didn't pass any more flatus   Objective: Vital signs in last 24 hours: Temp:  [97.9 F (36.6 C)-98.3 F (36.8 C)] 97.9 F (36.6 C) (05/19 0624) Pulse Rate:  [52-79] 79 (05/19 0624) Resp:  [16] 16 (05/19 0624) BP: (98-112)/(67-80) 98/71 (05/19 0624) SpO2:  [98 %-99 %] 99 % (05/19 0624) Last BM Date: 01/17/18  Intake/Output from previous day: 05/18 0701 - 05/19 0700 In: 4242.1 [P.O.:60; I.V.:4082.1; IV Piggyback:100] Out: -  Intake/Output this shift: No intake/output data recorded.  Exam: Abdomen soft, non-distended, minimally tender with no guarding  Lab Results:  Recent Labs    01/18/18 0802 01/19/18 0509  WBC 14.4* 7.4  HGB 17.9* 14.2  HCT 49.6 43.0  PLT 251 209   BMET Recent Labs    01/18/18 0802 01/19/18 0509  NA 137 140  K 3.9 4.4  CL 98* 103  CO2 26 30  GLUCOSE 137* 121*  BUN 12 9  CREATININE 1.21 1.16  CALCIUM 9.8 8.8*   PT/INR No results for input(s): LABPROT, INR in the last 72 hours. ABG No results for input(s): PHART, HCO3 in the last 72 hours.  Invalid input(s): PCO2, PO2  Studies/Results: Dg Abd 2 Views  Result Date: 01/19/2018 CLINICAL DATA:  Known small bowel obstruction EXAM: ABDOMEN - 2 VIEW COMPARISON:  Abdominal CT 01/18/2018 FINDINGS: Dilated loop of small bowel in the left abdomen with mild fold thickening. No convincing improvement from yesterday. Gas and stool intermittently seen along the colon compatible with early or partial obstruction. No concerning mass effect or gas collection. IMPRESSION: Known small bowel obstruction by CT. Mild small bowel distention and fold thickening with detected improvement from yesterday. Electronically Signed   By: Monte Fantasia M.D.   On: 01/19/2018 12:40   Ct Renal Stone Study  Result Date: 01/18/2018 CLINICAL DATA:  Abdominal cramping and vomiting. EXAM: CT ABDOMEN AND PELVIS WITHOUT CONTRAST TECHNIQUE: Multidetector CT imaging of  the abdomen and pelvis was performed following the standard protocol without IV contrast. COMPARISON:  10/01/2015 FINDINGS: Lower chest: Normal Hepatobiliary: Chronic benign appearing 2.5 cm of low-density right lobe of liver unchanged since the previous exam could be cyst or hemangioma. No calcified gallstones. Pancreas: Pronounced fatty change of the pancreatic tissue. Very little soft tissue density pancreatic tissue visible. Spleen: Normal Adrenals/Urinary Tract: Adrenal glands are normal. Right kidney shows chronic atrophy. Compensatory enlargement of the left kidney without acute finding. Stomach/Bowel: There are some dilated fluid and air-filled loops small intestine consistent with partial small bowel obstruction. Proximal small bowel and distal small bowel are decompressed. No abnormality of the: Than minimal sigmoid diverticulosis without evidence of diverticulitis. Vascular/Lymphatic: Aortic atherosclerosis. No aneurysm. IVC is normal. No retroperitoneal adenopathy. Reproductive: Normal Other: Small amount of free fluid in the pelvis. Musculoskeletal: Normal IMPRESSION: Small-bowel obstruction, proximal to mid ileum. Small bowel proximal to that and distal to that is decompressed. Small amount of associated intraperitoneal fluid. Specific cause of the obstruction not elucidated. Pronounced fatty change of the pancreatic tissue. Chronic atrophic right kidney with compensatory hypertrophy of the left kidney. Aortic atherosclerosis. Electronically Signed   By: Nelson Chimes M.D.   On: 01/18/2018 09:23    Anti-infectives: Anti-infectives (From admission, onward)   None      Assessment/Plan:  sbo  xrays yesterday showed minimal small bowel dilation with stool and air in the right colon  Will try clear liquids and give a suppository today  LOS: 2 days    [facility]  A 01/20/2018

## 2018-01-21 ENCOUNTER — Inpatient Hospital Stay (HOSPITAL_COMMUNITY)

## 2018-01-21 MED ORDER — MORPHINE SULFATE (PF) 2 MG/ML IV SOLN
2.0000 mg | INTRAVENOUS | Status: DC | PRN
  Administered 2018-01-21 – 2018-01-23 (×10): 2 mg via INTRAVENOUS
  Filled 2018-01-21 (×11): qty 1

## 2018-01-21 MED ORDER — CALCIUM CARBONATE ANTACID 500 MG PO CHEW
400.0000 mg | CHEWABLE_TABLET | Freq: Three times a day (TID) | ORAL | Status: DC | PRN
  Administered 2018-01-21: 400 mg via ORAL
  Filled 2018-01-21: qty 2

## 2018-01-21 NOTE — Progress Notes (Addendum)
[facility] Progress Note     Subjective: CC:  Feels that drinking clears "set him back". Passed tiny stool ball the size of a marble w/ dulcolax and had small episode of flatus yesterday. Endorses increased nausea, belching, reflux and emesis s/p clears. States that the MD down in [place] told him his nasal passageways were so small that he couldn't place an NG tube that was "large enough to work". He said the physician mentioned trying a pediatric tube.   Objective: Vital signs in last 24 hours: Temp:  [98 F (36.7 C)-98.5 F (36.9 C)] 98.2 F (36.8 C) (05/20 0605) Pulse Rate:  [55-78] 78 (05/20 0605) Resp:  [14-17] 16 (05/20 0605) BP: (106-123)/(73-88) 123/88 (05/20 0605) SpO2:  [96 %-98 %] 98 % (05/20 0605) Last BM Date: 01/20/18  Intake/Output from previous day: 05/19 0701 - 05/20 0700 In: 3750.4 [P.O.:840; I.V.:2860.4; IV Piggyback:50] Out: 5 [Stool:5] Intake/Output this shift: Total I/O In: 240 [P.O.:240] Out: -   PE: Gen:  Alert, NAD, pleasant and cooperative  Card:  Regular rate and rhythm, pedal pulses 2+ BL Pulm:  Normal effort, clear to auscultation bilaterally Abd: Soft, TTP epigastric region, mild distention, hypoactive BS, previous laparotomy scar  Skin: warm and dry, no rashes  Psych: A&Ox3   Lab Results:  Recent Labs    01/19/18 0509  WBC 7.4  HGB 14.2  HCT 43.0  PLT 209   BMET Recent Labs    01/19/18 0509  NA 140  K 4.4  CL 103  CO2 30  GLUCOSE 121*  BUN 9  CREATININE 1.16  CALCIUM 8.8*   PT/INR No results for input(s): LABPROT, INR in the last 72 hours. CMP     Component Value Date/Time   NA 140 01/19/2018 0509   K 4.4 01/19/2018 0509   CL 103 01/19/2018 0509   CO2 30 01/19/2018 0509   GLUCOSE 121 (H) 01/19/2018 0509   BUN 9 01/19/2018 0509   CREATININE 1.16 01/19/2018 0509   CALCIUM 8.8 (L) 01/19/2018 0509   PROT 8.0 01/18/2018 0802   ALBUMIN 4.9 01/18/2018 0802   AST 22 01/18/2018 0802   ALT 22 01/18/2018 0802    ALKPHOS 55 01/18/2018 0802   BILITOT 1.0 01/18/2018 0802   GFRNONAA >60 01/19/2018 0509   GFRAA >60 01/19/2018 0509   Lipase     Component Value Date/Time   LIPASE 22 01/18/2018 0802       Studies/Results: Dg Abd 2 Views  Result Date: 01/21/2018 CLINICAL DATA:  Small bowel obstruction EXAM: ABDOMEN - 2 VIEW COMPARISON:  Two days ago FINDINGS: Unchanged if not progressed small bowel distention with fluid levels. Gas and stool is still seen within the right colon. No concerning mass effect or gas collection. Lung bases are clear. IMPRESSION: Persistent small bowel obstruction with possible progressive small bowel distention. No clearing of the colon, favoring a partial obstruction. Electronically Signed   By: Monte Fantasia M.D.   On: 01/21/2018 07:59   Dg Abd 2 Views  Result Date: 01/19/2018 CLINICAL DATA:  Known small bowel obstruction EXAM: ABDOMEN - 2 VIEW COMPARISON:  Abdominal CT 01/18/2018 FINDINGS: Dilated loop of small bowel in the left abdomen with mild fold thickening. No convincing improvement from yesterday. Gas and stool intermittently seen along the colon compatible with early or partial obstruction. No concerning mass effect or gas collection. IMPRESSION: Known small bowel obstruction by CT. Mild small bowel distention and fold thickening with detected improvement from yesterday. Electronically Signed  By: Monte Fantasia M.D.   On: 01/19/2018 12:40    Anti-infectives: Anti-infectives (From admission, onward)   None     Assessment/Plan pSBO - PMH ex lap as child for traumatic injury - having reflux, belching, nausea, emesis with clear liquids - issues with NG tube placement 2/2 to small nasal passages; radiologist attempted 71F and 30F tubes on 5/17 w/out success.  FEN: NPO, IVF ID: None VTE: SCD's, Lovenox   Plan- pt would benefit from NG tube decompression but having issues due to nasal anatomy; will discuss re-attempting NGT tube placement with radiology.    LOS: 3 days    [facility] 01/21/2018, 8:57 AM Pager: [phone] Consults: [phone] Mon-Fri 7:00 am-4:30 pm Sat-Sun 7:00 am-11:30 am

## 2018-01-22 ENCOUNTER — Inpatient Hospital Stay (HOSPITAL_COMMUNITY): Admitting: Certified Registered Nurse Anesthetist

## 2018-01-22 ENCOUNTER — Inpatient Hospital Stay (HOSPITAL_COMMUNITY)

## 2018-01-22 ENCOUNTER — Encounter (HOSPITAL_COMMUNITY): Payer: Self-pay

## 2018-01-22 ENCOUNTER — Encounter (HOSPITAL_COMMUNITY): Admission: EM | Disposition: A | Discharge status: Home / Self Care | Payer: Self-pay | Source: Home / Self Care

## 2018-01-22 HISTORY — PX: LAPAROTOMY: SHX154

## 2018-01-22 LAB — BASIC METABOLIC PANEL
ANION GAP: 12 (ref 5–15)
BUN: 10 mg/dL (ref 6–20)
CO2: 24 mmol/L (ref 22–32)
Calcium: 9.2 mg/dL (ref 8.9–10.3)
Chloride: 99 mmol/L — ABNORMAL LOW (ref 101–111)
Creatinine, Ser: 1.01 mg/dL (ref 0.61–1.24)
GFR calc Af Amer: >60 mL/min (ref >60)
GFR calc non Af Amer: >60 mL/min (ref >60)
GLUCOSE: 162 mg/dL — AB (ref 65–99)
POTASSIUM: 4.2 mmol/L (ref 3.5–5.1)
Sodium: 135 mmol/L (ref 135–145)

## 2018-01-22 LAB — CBC
HEMATOCRIT: 42.9 % (ref 39.0–52.0)
Hemoglobin: 14.8 g/dL (ref 13.0–17.0)
MCH: 34 pg (ref 26.0–34.0)
MCHC: 34.5 g/dL (ref 30.0–36.0)
MCV: 98.6 fL (ref 78.0–100.0)
Platelets: 191 10*3/uL (ref 150–400)
RBC: 4.35 MIL/uL (ref 4.22–5.81)
RDW: 11.8 % (ref 11.5–15.5)
WBC: 10.2 10*3/uL (ref 4.0–10.5)

## 2018-01-22 LAB — SURGICAL PCR SCREEN
MRSA, PCR: POSITIVE — AB
STAPHYLOCOCCUS AUREUS: POSITIVE — AB

## 2018-01-22 SURGERY — LAPAROTOMY, EXPLORATORY
Anesthesia: General | Site: Abdomen

## 2018-01-22 MED ORDER — DEXAMETHASONE SODIUM PHOSPHATE 10 MG/ML IJ SOLN
INTRAMUSCULAR | Status: AC
  Filled 2018-01-22: qty 1

## 2018-01-22 MED ORDER — ACETAMINOPHEN 10 MG/ML IV SOLN
1000.0000 mg | Freq: Four times a day (QID) | INTRAVENOUS | Status: DC
  Administered 2018-01-22 – 2018-01-23 (×3): 1000 mg via INTRAVENOUS
  Filled 2018-01-22 (×2): qty 100

## 2018-01-22 MED ORDER — ONDANSETRON HCL 4 MG/2ML IJ SOLN
INTRAMUSCULAR | Status: AC
  Filled 2018-01-22: qty 2

## 2018-01-22 MED ORDER — SUCCINYLCHOLINE CHLORIDE 200 MG/10ML IV SOSY
PREFILLED_SYRINGE | INTRAVENOUS | Status: DC | PRN
  Administered 2018-01-22: 120 mg via INTRAVENOUS

## 2018-01-22 MED ORDER — MUPIROCIN 2 % EX OINT
1.0000 "application " | TOPICAL_OINTMENT | Freq: Two times a day (BID) | CUTANEOUS | Status: AC
  Administered 2018-01-22 – 2018-01-27 (×10): 1 via NASAL
  Filled 2018-01-22 (×2): qty 22

## 2018-01-22 MED ORDER — LABETALOL HCL 5 MG/ML IV SOLN
INTRAVENOUS | Status: DC | PRN
  Administered 2018-01-22: 5 mg via INTRAVENOUS

## 2018-01-22 MED ORDER — ROCURONIUM BROMIDE 10 MG/ML (PF) SYRINGE
PREFILLED_SYRINGE | INTRAVENOUS | Status: AC
  Filled 2018-01-22: qty 5

## 2018-01-22 MED ORDER — ONDANSETRON HCL 4 MG/2ML IJ SOLN: 4.0000 mg | Freq: Once | INTRAMUSCULAR | Status: DC | PRN

## 2018-01-22 MED ORDER — FENTANYL CITRATE (PF) 250 MCG/5ML IJ SOLN
INTRAMUSCULAR | Status: AC
  Filled 2018-01-22: qty 5

## 2018-01-22 MED ORDER — FENTANYL CITRATE (PF) 100 MCG/2ML IJ SOLN
50.0000 ug | INTRAMUSCULAR | Status: DC
  Administered 2018-01-22: 50 ug via INTRAVENOUS

## 2018-01-22 MED ORDER — ACETAMINOPHEN 10 MG/ML IV SOLN
INTRAVENOUS | Status: AC
  Administered 2018-01-22: 1000 mg via INTRAVENOUS
  Filled 2018-01-22: qty 100

## 2018-01-22 MED ORDER — MIDAZOLAM HCL 2 MG/2ML IJ SOLN
INTRAMUSCULAR | Status: AC
  Administered 2018-01-22: 1 mg via INTRAVENOUS
  Filled 2018-01-22: qty 2

## 2018-01-22 MED ORDER — LABETALOL HCL 5 MG/ML IV SOLN
INTRAVENOUS | Status: AC
  Filled 2018-01-22: qty 4

## 2018-01-22 MED ORDER — SUGAMMADEX SODIUM 200 MG/2ML IV SOLN
INTRAVENOUS | Status: AC
  Filled 2018-01-22: qty 2

## 2018-01-22 MED ORDER — METHOCARBAMOL 1000 MG/10ML IJ SOLN
500.0000 mg | Freq: Three times a day (TID) | INTRAVENOUS | Status: DC
  Administered 2018-01-22 – 2018-01-30 (×23): 500 mg via INTRAVENOUS
  Filled 2018-01-22: qty 5
  Filled 2018-01-22 (×5): qty 550
  Filled 2018-01-22: qty 5
  Filled 2018-01-22 (×14): qty 550
  Filled 2018-01-22: qty 5
  Filled 2018-01-22 (×4): qty 550

## 2018-01-22 MED ORDER — PROPOFOL 10 MG/ML IV BOLUS
INTRAVENOUS | Status: AC
  Filled 2018-01-22: qty 20

## 2018-01-22 MED ORDER — ONDANSETRON HCL 4 MG/2ML IJ SOLN
INTRAMUSCULAR | Status: DC | PRN
  Administered 2018-01-22: 4 mg via INTRAVENOUS

## 2018-01-22 MED ORDER — FENTANYL CITRATE (PF) 100 MCG/2ML IJ SOLN
INTRAMUSCULAR | Status: AC
  Filled 2018-01-22: qty 2

## 2018-01-22 MED ORDER — LACTATED RINGERS IV SOLN
INTRAVENOUS | Status: DC
  Administered 2018-01-22: 1000 mL via INTRAVENOUS
  Administered 2018-01-22: 15:00:00 via INTRAVENOUS

## 2018-01-22 MED ORDER — FENTANYL CITRATE (PF) 100 MCG/2ML IJ SOLN
INTRAMUSCULAR | Status: AC
  Administered 2018-01-22: 50 ug via INTRAVENOUS
  Filled 2018-01-22: qty 2

## 2018-01-22 MED ORDER — SUCCINYLCHOLINE CHLORIDE 200 MG/10ML IV SOSY
PREFILLED_SYRINGE | INTRAVENOUS | Status: AC
  Filled 2018-01-22: qty 10

## 2018-01-22 MED ORDER — LIDOCAINE 2% (20 MG/ML) 5 ML SYRINGE
INTRAMUSCULAR | Status: AC
  Filled 2018-01-22: qty 5

## 2018-01-22 MED ORDER — HYDROMORPHONE HCL 1 MG/ML IJ SOLN
0.2500 mg | INTRAMUSCULAR | Status: DC | PRN
  Administered 2018-01-22 (×3): 0.25 mg via INTRAVENOUS

## 2018-01-22 MED ORDER — PROPOFOL 10 MG/ML IV BOLUS
INTRAVENOUS | Status: DC | PRN
  Administered 2018-01-22: 140 mg via INTRAVENOUS

## 2018-01-22 MED ORDER — HYDROMORPHONE HCL 1 MG/ML IJ SOLN
INTRAMUSCULAR | Status: AC
  Administered 2018-01-22: 0.25 mg via INTRAVENOUS
  Filled 2018-01-22: qty 1

## 2018-01-22 MED ORDER — MEPERIDINE HCL 50 MG/ML IJ SOLN: 6.2500 mg | INTRAMUSCULAR | Status: DC | PRN

## 2018-01-22 MED ORDER — ROCURONIUM BROMIDE 50 MG/5ML IV SOSY
PREFILLED_SYRINGE | INTRAVENOUS | Status: DC | PRN
  Administered 2018-01-22: 20 mg via INTRAVENOUS
  Administered 2018-01-22: 40 mg via INTRAVENOUS

## 2018-01-22 MED ORDER — CHLORHEXIDINE GLUCONATE CLOTH 2 % EX PADS
6.0000 | MEDICATED_PAD | Freq: Every day | CUTANEOUS | Status: AC
  Administered 2018-01-23 – 2018-01-27 (×5): 6 via TOPICAL

## 2018-01-22 MED ORDER — MIDAZOLAM HCL 2 MG/2ML IJ SOLN
INTRAMUSCULAR | Status: AC
  Filled 2018-01-22: qty 2

## 2018-01-22 MED ORDER — FENTANYL CITRATE (PF) 100 MCG/2ML IJ SOLN
INTRAMUSCULAR | Status: DC | PRN
  Administered 2018-01-22 (×2): 100 ug via INTRAVENOUS
  Administered 2018-01-22: 50 ug via INTRAVENOUS
  Administered 2018-01-22: 100 ug via INTRAVENOUS

## 2018-01-22 MED ORDER — 0.9 % SODIUM CHLORIDE (POUR BTL) OPTIME
TOPICAL | Status: DC | PRN
  Administered 2018-01-22: 2000 mL

## 2018-01-22 MED ORDER — MIDAZOLAM HCL 2 MG/2ML IJ SOLN
1.0000 mg | INTRAMUSCULAR | Status: DC
  Administered 2018-01-22: 1 mg via INTRAVENOUS

## 2018-01-22 MED ORDER — CEFAZOLIN SODIUM-DEXTROSE 2-4 GM/100ML-% IV SOLN
2.0000 g | INTRAVENOUS | Status: AC
  Administered 2018-01-22: 2 g via INTRAVENOUS
  Filled 2018-01-22 (×2): qty 100

## 2018-01-22 MED ORDER — MIDAZOLAM HCL 5 MG/5ML IJ SOLN
INTRAMUSCULAR | Status: DC | PRN
  Administered 2018-01-22: 2 mg via INTRAVENOUS

## 2018-01-22 MED ORDER — SUGAMMADEX SODIUM 200 MG/2ML IV SOLN
INTRAVENOUS | Status: DC | PRN
  Administered 2018-01-22: 200 mg via INTRAVENOUS

## 2018-01-22 SURGICAL SUPPLY — 44 items
BLADE EXTENDED COATED 6.5IN (ELECTRODE) IMPLANT
CELLS DAT CNTRL 66122 CELL SVR (MISCELLANEOUS) IMPLANT
CHLORAPREP W/TINT 26ML (MISCELLANEOUS) IMPLANT
COUNTER NEEDLE 20 DBL MAG RED (NEEDLE) IMPLANT
COVER MAYO STAND STRL (DRAPES) IMPLANT
DRAIN CHANNEL 19F RND (DRAIN) IMPLANT
DRAPE LAPAROSCOPIC ABDOMINAL (DRAPES) ×3 IMPLANT
DRAPE SHEET LG 3/4 BI-LAMINATE (DRAPES) IMPLANT
DRSG OPSITE POSTOP 4X10 (GAUZE/BANDAGES/DRESSINGS) ×3 IMPLANT
DRSG OPSITE POSTOP 4X6 (GAUZE/BANDAGES/DRESSINGS) IMPLANT
DRSG OPSITE POSTOP 4X8 (GAUZE/BANDAGES/DRESSINGS) IMPLANT
ELECT PENCIL ROCKER SW 15FT (MISCELLANEOUS) IMPLANT
ELECT REM PT RETURN 15FT ADLT (MISCELLANEOUS) ×3 IMPLANT
EVACUATOR SILICONE 100CC (DRAIN) IMPLANT
GAUZE SPONGE 4X4 12PLY STRL (GAUZE/BANDAGES/DRESSINGS) IMPLANT
GLOVE BIO SURGEON STRL SZ 6.5 (GLOVE) ×4 IMPLANT
GLOVE BIO SURGEONS STRL SZ 6.5 (GLOVE) ×2
GLOVE BIOGEL PI IND STRL 7.0 (GLOVE) ×2 IMPLANT
GLOVE BIOGEL PI INDICATOR 7.0 (GLOVE) ×4
GOWN STRL REUS W/TWL 2XL LVL3 (GOWN DISPOSABLE) ×3 IMPLANT
GOWN STRL REUS W/TWL XL LVL3 (GOWN DISPOSABLE) ×12 IMPLANT
HANDLE SUCTION POOLE (INSTRUMENTS) IMPLANT
LEGGING LITHOTOMY PAIR STRL (DRAPES) IMPLANT
PACK COLON (CUSTOM PROCEDURE TRAY) ×3 IMPLANT
RETRACTOR WND ALEXIS 25 LRG (MISCELLANEOUS) IMPLANT
RTRCTR WOUND ALEXIS 18CM MED (MISCELLANEOUS)
RTRCTR WOUND ALEXIS 25CM LRG (MISCELLANEOUS)
SPONGE LAP 18X18 5 PK (GAUZE/BANDAGES/DRESSINGS) ×3 IMPLANT
STAPLER VISISTAT 35W (STAPLE) IMPLANT
SUCTION POOLE HANDLE (INSTRUMENTS)
SUT ETHILON 3 0 PS 1 (SUTURE) IMPLANT
SUT NOVA 1 T20/GS 25DT (SUTURE) ×6 IMPLANT
SUT NOVA NAB GS-21 0 18 T12 DT (SUTURE) IMPLANT
SUT PDS AB 1 CTX 36 (SUTURE) ×3 IMPLANT
SUT SILK 2 0 (SUTURE) ×2
SUT SILK 2 0 SH CR/8 (SUTURE) ×3 IMPLANT
SUT SILK 2-0 18XBRD TIE 12 (SUTURE) ×1 IMPLANT
SUT SILK 3 0 (SUTURE) ×2
SUT SILK 3 0 SH CR/8 (SUTURE) ×3 IMPLANT
SUT SILK 3-0 18XBRD TIE 12 (SUTURE) ×1 IMPLANT
SUT VIC AB 2-0 SH 18 (SUTURE) ×3 IMPLANT
TOWEL OR 17X26 10 PK STRL BLUE (TOWEL DISPOSABLE) IMPLANT
TOWEL OR NON WOVEN STRL DISP B (DISPOSABLE) IMPLANT
TRAY FOLEY MTR SLVR 16FR STAT (SET/KITS/TRAYS/PACK) ×3 IMPLANT

## 2018-01-22 NOTE — Anesthesia Procedure Notes (Signed)
Procedure Name: Intubation Date/Time: 01/22/2018 2:42 PM Performed by: Glory Buff, CRNA Pre-anesthesia Checklist: Patient identified, Emergency Drugs available, Suction available and Patient being monitored Patient Re-evaluated:Patient Re-evaluated prior to induction Oxygen Delivery Method: Circle system utilized Preoxygenation: Pre-oxygenation with 100% oxygen Induction Type: IV induction, Cricoid Pressure applied and Rapid sequence Ventilation: Mask ventilation without difficulty Laryngoscope Size: Miller and 3 Grade View: Grade I Tube type: Oral Tube size: 7.5 mm Number of attempts: 1 Airway Equipment and Method: Stylet and Oral airway Placement Confirmation: ETT inserted through vocal cords under direct vision,  positive ETCO2 and breath sounds checked- equal and bilateral Secured at: 22 cm Tube secured with: Tape Dental Injury: Teeth and Oropharynx as per pre-operative assessment

## 2018-01-22 NOTE — Anesthesia Preprocedure Evaluation (Addendum)
Anesthesia Evaluation  Patient identified by MRN, date of birth, ID band Patient awake    Reviewed: Allergy & Precautions, NPO status , Patient's Chart, lab work & pertinent test results  Airway Mallampati: I  TM Distance: >3 FB Neck ROM: Full    Dental   Pulmonary    Pulmonary exam normal        Cardiovascular Normal cardiovascular exam     Neuro/Psych    GI/Hepatic GERD  Medicated and Controlled,  Endo/Other    Renal/GU      Musculoskeletal   Abdominal   Peds  Hematology   Anesthesia Other Findings   Reproductive/Obstetrics                             Anesthesia Physical Anesthesia Plan  ASA: II  Anesthesia Plan: General   Post-op Pain Management:    Induction:   PONV Risk Score and Plan: 2 and Ondansetron and Midazolam  Airway Management Planned: Oral ETT  Additional Equipment:   Intra-op Plan:   Post-operative Plan: Extubation in OR  Informed Consent: I have reviewed the patients History and Physical, chart, labs and discussed the procedure including the risks, benefits and alternatives for the proposed anesthesia with the patient or authorized representative who has indicated his/her understanding and acceptance.     Plan Discussed with: CRNA and Surgeon  Anesthesia Plan Comments:         Anesthesia Quick Evaluation

## 2018-01-22 NOTE — Transfer of Care (Signed)
Immediate Anesthesia Transfer of Care Note  Patient: Edward Hoover  Procedure(s) Performed: EXPLORATORY LAPAROTOMY LYSIS OF ADHESIONS (N/A Abdomen)  Patient Location: PACU  Anesthesia Type:General  Level of Consciousness: awake, alert  and oriented  Airway & Oxygen Therapy: Patient Spontanous Breathing and Patient connected to face mask oxygen  Post-op Assessment: Report given to RN and Post -op Vital signs reviewed and stable  Post vital signs: Reviewed and stable  Last Vitals:  Vitals Value Taken Time  BP 140/101 01/22/2018  4:15 PM  Temp    Pulse 89 01/22/2018  4:18 PM  Resp 18 01/22/2018  4:18 PM  SpO2 91 % 01/22/2018  4:18 PM  Vitals shown include unvalidated device data.  Last Pain:  Vitals:   01/22/18 1415  TempSrc:   PainSc: Asleep      Patients Stated Pain Goal: 4 (07/21/34 6701)  Complications: No apparent anesthesia complications

## 2018-01-22 NOTE — Progress Notes (Signed)
[facility] Progress Note     Subjective: CC:  Pt reports significant abdominal pain and distention, worse compared to yesterday. Belching a lot during my exam. Drank PO contrast yesterday and kept it down for 1h before vomiting. States he has been vomiting for the past 8 hours and gets some temporary relief with phenergan. Denies flatus or BM.   Objective: Vital signs in last 24 hours: Temp:  [98.3 F (36.8 C)-99 F (37.2 C)] 99 F (37.2 C) (05/21 0623) Pulse Rate:  [71-101] 101 (05/21 0623) Resp:  [16] 16 (05/20 1439) BP: (111-128)/(85-89) 118/89 (05/21 0623) SpO2:  [93 %-97 %] 93 % (05/21 0623) Last BM Date: 01/20/18  Intake/Output from previous day: 05/20 0701 - 05/21 0700 In: 3342.1 [P.O.:240; I.V.:3052.1; IV Piggyback:50] Out: -  Intake/Output this shift: No intake/output data recorded.  PE: Gen:  Alert, appears uncomfortable, no acute distress Card:  Regular rate and rhythm, pedal pulses 2+ BL Pulm:  Normal effort, clear to auscultation bilaterally Abd: Soft, moderate-severe distention, hypoactive BS, tympanic, TTP epigastrium/upper abdomen, previous laparotomy scar noted. Skin: warm and dry, no rashes  Psych: A&Ox3   Lab Results:  Recent Labs    01/22/18 0532  WBC 10.2  HGB 14.8  HCT 42.9  PLT 191   BMET Recent Labs    01/22/18 0532  NA 135  K 4.2  CL 99*  CO2 24  GLUCOSE 162*  BUN 10  CREATININE 1.01  CALCIUM 9.2   PT/INR No results for input(s): LABPROT, INR in the last 72 hours. CMP     Component Value Date/Time   NA 135 01/22/2018 0532   K 4.2 01/22/2018 0532   CL 99 (L) 01/22/2018 0532   CO2 24 01/22/2018 0532   GLUCOSE 162 (H) 01/22/2018 0532   BUN 10 01/22/2018 0532   CREATININE 1.01 01/22/2018 0532   CALCIUM 9.2 01/22/2018 0532   PROT 8.0 01/18/2018 0802   ALBUMIN 4.9 01/18/2018 0802   AST 22 01/18/2018 0802   ALT 22 01/18/2018 0802   ALKPHOS 55 01/18/2018 0802   BILITOT 1.0 01/18/2018 0802   GFRNONAA >60  01/22/2018 0532   GFRAA >60 01/22/2018 0532   Lipase     Component Value Date/Time   LIPASE 22 01/18/2018 0802       Studies/Results: Dg Abd 2 Views  Result Date: 01/21/2018 CLINICAL DATA:  Small bowel obstruction EXAM: ABDOMEN - 2 VIEW COMPARISON:  Two days ago FINDINGS: Unchanged if not progressed small bowel distention with fluid levels. Gas and stool is still seen within the right colon. No concerning mass effect or gas collection. Lung bases are clear. IMPRESSION: Persistent small bowel obstruction with possible progressive small bowel distention. No clearing of the colon, favoring a partial obstruction. Electronically Signed   By: Monte Fantasia M.D.   On: 01/21/2018 07:59   Dg Abd Portable 1v-small Bowel Obstruction Protocol-initial, 8 Hr Delay  Result Date: 01/22/2018 CLINICAL DATA:  Small bowel obstruction, 8 hour post contrast exam. EXAM: PORTABLE ABDOMEN - 1 VIEW COMPARISON:  Radiographs earlier this day. FINDINGS: Administered enteric contrast remains in small bowel. There is no enteric contrast in the colon. Persistent small bowel dilatation the small bowel diameter measuring up to 4 cm. IMPRESSION: Persistent small bowel obstruction with no enteric contrast in the colon on 8 hour delayed film. Recommend 24 hour post contrast exam. Electronically Signed   By: Jeb Levering M.D.   On: 01/22/2018 03:00    Anti-infectives: Anti-infectives (From admission, onward)  None     Assessment/Plan pSBO - PMH ex lap as child for traumatic injury - issues with NG tube placement 2/2 to small nasal passages; radiologist attempted 36F and 25F tubes on 5/17 w/out success. - PO gastrografin 5/20 w/out clinical improvement - worsening distention, nausea, vomiting, with obstipation  - persistent SBO pattern onDG abdomen   FEN: NPO, IVF ID: None VTE: SCD's, Lovenox   Plan- Unable to decompress with NG tube, no improvement of symptoms with PO contrast and bowel rest. At this point  I think the patient requires exploratory laparotomy.    LOS: 4 days    [facility] 01/22/2018, 8:00 AM Pager: [phone] Consults: [phone] Mon-Fri 7:00 am-4:30 pm Sat-Sun 7:00 am-11:30 am

## 2018-01-22 NOTE — Anesthesia Postprocedure Evaluation (Signed)
Anesthesia Post Note  Patient: Edward Hoover  Procedure(s) Performed: EXPLORATORY LAPAROTOMY LYSIS OF ADHESIONS (N/A Abdomen)     Patient location during evaluation: PACU Anesthesia Type: General Level of consciousness: awake and alert Pain management: pain level controlled Vital Signs Assessment: post-procedure vital signs reviewed and stable Respiratory status: spontaneous breathing, nonlabored ventilation, respiratory function stable and patient connected to nasal cannula oxygen Cardiovascular status: blood pressure returned to baseline and stable Postop Assessment: no apparent nausea or vomiting Anesthetic complications: no    Last Vitals:  Vitals:   01/22/18 1809 01/22/18 1926  BP: (!) 130/95 127/88  Pulse: 85 93  Resp: 16 18  Temp: 37.2 C 37.2 C  SpO2: 99% 98%    Last Pain:  Vitals:   01/22/18 1926  TempSrc: Oral  PainSc:                  Chandon Lazcano DAVID

## 2018-01-22 NOTE — Op Note (Signed)
01/22/2018  4:06 PM  PATIENT:  Edward Hoover  57 y.o. male  Patient Care Team: Rochel Brome, MD as PCP - General (Family Medicine)  PRE-OPERATIVE DIAGNOSIS:  bowel obstruction  POST-OPERATIVE DIAGNOSIS:  bowel obstruction  PROCEDURE: EXPLORATORY LAPAROTOMY  LYSIS OF ADHESIONS   Surgeon(s): Leighton Ruff, MD  ASSISTANT: Obie Dredge, PA  ANESTHESIA:   general  EBL:  Total I/O In: 1791.7 [I.V.:1791.7] Out: 420 [Urine:200; Emesis/NG output:200; Blood:20]  DRAINS: none   SPECIMEN:  No Specimen  DISPOSITION OF SPECIMEN:  N/A  COUNTS:  YES  PLAN OF CARE: Pt already admitted  PATIENT DISPOSITION:  PACU - hemodynamically stable.  INDICATION: 57 year old male status post exploratory laparotomy x2 approximately 50 years ago presents to the emergency department with small bowel obstruction.  We were unable to place an NG tube.  Multiple attempts by floor nurses and radiology staff were unsuccessful.  His pain and distention got worse.  Today we decided to take him to the operating room urgently.   OR FINDINGS: Small bowel obstruction due to adhesion in the right upper quadrant  DESCRIPTION: the patient was identified in the preoperative holding area and taken to the OR where they were laid supine on the operating room table.  General anesthesia was induced without difficulty. SCDs were also noted to be in place prior to the initiation of anesthesia.  The patient was then prepped and draped in the usual sterile fashion.   A surgical timeout was performed indicating the correct patient, procedure, positioning and need for preoperative antibiotics.   I began by making an upper midline incision using a 10 blade scalpel.  Dissection was carried down through subcutaneous levels to the fascia.  Fascia was incised at midline.  The peritoneum was elevated with Coker clamps.  The patient had dense adhesions to the right upper quadrant due to his previous laparotomy site.  I  carefully took down the small bowel and omentum that was adhesed to the right upper quadrant.  After all of this was freed, I removed the small bowel from the abdomen.  The obstruction site was noted and due to a band of scar from the omentum over a portion of small bowel.  This was freed with Metzenbaum scissors.  I continued to free and lyse adhesions distally.  I identified the terminal ileum as it entered into the cecum.  I ran the bowel back towards the level of the obstruction.  I placed 3-0 silk sutures on a serosal tear.  Identified the area of concern.  This bowel was initially rather dusky but when given time it began to perfuse well.  There was a palpable pulse felt within the mesentery.  The bowel did have a large portion of venous congestion proximally.  I ran the small bowel proximally to the ligament of Treitz with no other adhesions found.  This was then all placed back into the abdomen.  I checked the position of the NG tube and noted it in the fundus of the stomach.  The omentum was brought down over the small bowel and the fascia was closed using #1 non-looped running PDS suture.  The skin was closed with staples.  A sterile dressing was applied.  The patient was then awakened from anesthesia and sent to the postanesthesia care unit in stable condition.  All counts were correct per operating room staff.

## 2018-01-23 ENCOUNTER — Encounter (HOSPITAL_COMMUNITY): Payer: Self-pay | Admitting: General Surgery

## 2018-01-23 MED ORDER — ACETAMINOPHEN 10 MG/ML IV SOLN
1000.0000 mg | Freq: Four times a day (QID) | INTRAVENOUS | Status: AC
  Administered 2018-01-23 – 2018-01-24 (×3): 1000 mg via INTRAVENOUS
  Filled 2018-01-23 (×5): qty 100

## 2018-01-23 MED ORDER — MORPHINE SULFATE (PF) 2 MG/ML IV SOLN
1.0000 mg | INTRAVENOUS | Status: DC | PRN
  Administered 2018-01-23: 2 mg via INTRAVENOUS
  Administered 2018-01-23 – 2018-01-24 (×3): 4 mg via INTRAVENOUS
  Administered 2018-01-24: 2 mg via INTRAVENOUS
  Administered 2018-01-24 – 2018-01-26 (×6): 4 mg via INTRAVENOUS
  Administered 2018-01-26: 2 mg via INTRAVENOUS
  Administered 2018-01-26: 4 mg via INTRAVENOUS
  Administered 2018-01-26 (×2): 2 mg via INTRAVENOUS
  Administered 2018-01-26: 4 mg via INTRAVENOUS
  Administered 2018-01-27 – 2018-01-28 (×6): 2 mg via INTRAVENOUS
  Filled 2018-01-23: qty 2
  Filled 2018-01-23: qty 1
  Filled 2018-01-23: qty 2
  Filled 2018-01-23 (×3): qty 1
  Filled 2018-01-23: qty 2
  Filled 2018-01-23: qty 1
  Filled 2018-01-23 (×4): qty 2
  Filled 2018-01-23: qty 1
  Filled 2018-01-23: qty 2
  Filled 2018-01-23: qty 1
  Filled 2018-01-23: qty 2
  Filled 2018-01-23: qty 1
  Filled 2018-01-23 (×2): qty 2
  Filled 2018-01-23: qty 1
  Filled 2018-01-23 (×3): qty 2

## 2018-01-23 NOTE — Progress Notes (Signed)
Patient ID: Edward Hoover, male   DOB: [birth year], 57 y.o.   MRN: [id number]    1 Day Post-Op  Subjective: Pt doesn't feel great today.  Throat hurts a lot.  No bowel function yet.  Objective: Vital signs in last 24 hours: Temp:  [97.8 F (36.6 C)-99.6 F (37.6 C)] 98.6 F (37 C) (05/22 0510) Pulse Rate:  [85-110] 110 (05/22 0510) Resp:  [12-20] 16 (05/22 0510) BP: (113-145)/(81-115) 127/93 (05/22 0510) SpO2:  [92 %-99 %] 98 % (05/22 0510) Weight:  [81.2 kg (179 lb)] 81.2 kg (179 lb) (05/21 1307) Last BM Date: 01/17/18(per pt)  Intake/Output from previous day: 05/21 0701 - 05/22 0700 In: 4442.5 [I.V.:4087.5; IV Piggyback:355] Out: 3532 [Urine:1280; Emesis/NG output:650; Blood:20] Intake/Output this shift: No intake/output data recorded.  PE: Abd: appropriately tender, absent BS, NGT with 450cc of bilious output overnight.  Midline incision is c/d/i with staples and honeycomb dressing with minimal old bloody drainage. Heart: regular Lungs: CTAB  Lab Results:  Recent Labs    01/22/18 0532  WBC 10.2  HGB 14.8  HCT 42.9  PLT 191   BMET Recent Labs    01/22/18 0532  NA 135  K 4.2  CL 99*  CO2 24  GLUCOSE 162*  BUN 10  CREATININE 1.01  CALCIUM 9.2   PT/INR No results for input(s): LABPROT, INR in the last 72 hours. CMP     Component Value Date/Time   NA 135 01/22/2018 0532   K 4.2 01/22/2018 0532   CL 99 (L) 01/22/2018 0532   CO2 24 01/22/2018 0532   GLUCOSE 162 (H) 01/22/2018 0532   BUN 10 01/22/2018 0532   CREATININE 1.01 01/22/2018 0532   CALCIUM 9.2 01/22/2018 0532   PROT 8.0 01/18/2018 0802   ALBUMIN 4.9 01/18/2018 0802   AST 22 01/18/2018 0802   ALT 22 01/18/2018 0802   ALKPHOS 55 01/18/2018 0802   BILITOT 1.0 01/18/2018 0802   GFRNONAA >60 01/22/2018 0532   GFRAA >60 01/22/2018 0532   Lipase     Component Value Date/Time   LIPASE 22 01/18/2018 0802       Studies/Results: Dg Abd Portable 1v-small Bowel Obstruction  Protocol-initial, 8 Hr Delay  Result Date: 01/22/2018 CLINICAL DATA:  Small bowel obstruction, 8 hour post contrast exam. EXAM: PORTABLE ABDOMEN - 1 VIEW COMPARISON:  Radiographs earlier this day. FINDINGS: Administered enteric contrast remains in small bowel. There is no enteric contrast in the colon. Persistent small bowel dilatation the small bowel diameter measuring up to 4 cm. IMPRESSION: Persistent small bowel obstruction with no enteric contrast in the colon on 8 hour delayed film. Recommend 24 hour post contrast exam. Electronically Signed   By: Jeb Levering M.D.   On: 01/22/2018 03:00    Anti-infectives: Anti-infectives (From admission, onward)   Start     Dose/Rate Route Frequency Ordered Stop   01/22/18 0845  ceFAZolin (ANCEF) IVPB 2g/100 mL premix     2 g 200 mL/hr over 30 Minutes Intravenous On call to O.R. 01/22/18 0835 01/22/18 1513       Assessment/Plan POD 1, ex lap with LOA for SBO  -Post op ileus, await bowel function.  Cong NGT for now.  cepacol and chloraseptic spray for throat pain.  May have gum and hard candy as well -mobilize at least TID -IS -cont IV tylenol today for pain control. -cont robaxin -adjust morphine to 1-4mg  q2 hrs to help control pain better.  We did discuss narcotics can slow the gut  down, but do want his pain better controlled. -prn ativan for sleep.  Can't take benadryl  FEN - NPO/NGT/IVFs VTE - Heparin/SCDs ID - ancef preop   LOS: 5 days    Henreitta Cea , [facility] 01/23/2018, 8:16 AM Pager: [phone]

## 2018-01-24 NOTE — Plan of Care (Signed)
Plan of care discussed with patient 

## 2018-01-24 NOTE — Progress Notes (Signed)
Patient ID: Edward Hoover, male   DOB: 27-Feb-1961, 57 y.o.   MRN: [id number]    2 Days Post-Op  Subjective: Pt feels a bit better. Ambulating in hall.  No bowel function yet.  Objective: Vital signs in last 24 hours: Temp:  [98.1 F (36.7 C)-99.3 F (37.4 C)] 99.3 F (37.4 C) (05/23 4970) Pulse Rate:  [86-105] 92 (05/23 0613) Resp:  [15-17] 17 (05/23 0613) BP: (132-149)/(84-94) 132/84 (05/23 0613) SpO2:  [92 %-95 %] 95 % (05/23 0613) Last BM Date: 01/17/18(per pt)  Intake/Output from previous day: 05/22 0701 - 05/23 0700 In: 4120 [I.V.:3500; IV Piggyback:620] Out: 2450 [Urine:1900; Emesis/NG output:550] Intake/Output this shift: No intake/output data recorded.  PE: Abd: appropriately tender, NGT with 550cc of light bilious output overnight.  Midline incision is c/d/i with staples and honeycomb dressing with minimal old bloody drainage. Heart: regular Lungs: CTAB  Lab Results:  Recent Labs    01/22/18 0532  WBC 10.2  HGB 14.8  HCT 42.9  PLT 191   BMET Recent Labs    01/22/18 0532  NA 135  K 4.2  CL 99*  CO2 24  GLUCOSE 162*  BUN 10  CREATININE 1.01  CALCIUM 9.2   PT/INR No results for input(s): LABPROT, INR in the last 72 hours. CMP     Component Value Date/Time   NA 135 01/22/2018 0532   K 4.2 01/22/2018 0532   CL 99 (L) 01/22/2018 0532   CO2 24 01/22/2018 0532   GLUCOSE 162 (H) 01/22/2018 0532   BUN 10 01/22/2018 0532   CREATININE 1.01 01/22/2018 0532   CALCIUM 9.2 01/22/2018 0532   PROT 8.0 01/18/2018 0802   ALBUMIN 4.9 01/18/2018 0802   AST 22 01/18/2018 0802   ALT 22 01/18/2018 0802   ALKPHOS 55 01/18/2018 0802   BILITOT 1.0 01/18/2018 0802   GFRNONAA >60 01/22/2018 0532   GFRAA >60 01/22/2018 0532   Lipase     Component Value Date/Time   LIPASE 22 01/18/2018 0802       Studies/Results: No results found.  Anti-infectives: Anti-infectives (From admission, onward)   Start     Dose/Rate Route Frequency Ordered Stop   01/22/18 0845  ceFAZolin (ANCEF) IVPB 2g/100 mL premix     2 g 200 mL/hr over 30 Minutes Intravenous On call to O.R. 01/22/18 0835 01/22/18 1513       Assessment/Plan POD 2, ex lap with LOA for SBO  -Post op ileus, await bowel function.  Cong NGT for now.  cepacol and chloraseptic spray for throat pain.  May have gum and hard candy as well -mobilize at least TID -IS -cont IV tylenol today for pain control. -cont robaxin -adjust morphine to 1-4mg  q2 hrs to help control pain better.  We did discuss narcotics can slow the gut down, but do want his pain better controlled. -prn ativan for sleep.  Can't take benadryl  FEN - NPO/NGT/IVFs VTE - Heparin/SCDs ID - ancef preop   LOS: 6 days     Rosario Adie, MD  [facility]

## 2018-01-25 NOTE — Progress Notes (Addendum)
Patient ID: Edward Hoover, male   DOB: 21-Jun-1961, 57 y.o.   MRN: [id number]    3 Days Post-Op  Subjective: Pt looks better today.  No flatus yet, but hears growling in his belly.  Mobilizing around the unit like 19 laps already today.  Objective: Vital signs in last 24 hours: Temp:  [98.7 F (37.1 C)-99.2 F (37.3 C)] 99.1 F (37.3 C) (05/24 0609) Pulse Rate:  [81-90] 90 (05/24 0609) Resp:  [16-18] 18 (05/24 0609) BP: (122-134)/(78-85) 128/80 (05/24 0609) SpO2:  [92 %-94 %] 92 % (05/24 0609) Last BM Date: 01/17/18(per pt)  Intake/Output from previous day: 05/23 0701 - 05/24 0700 In: 2655.8 [I.V.:2445.8; IV Piggyback:210] Out: 8921 [Urine:2450; Emesis/NG output:1025] Intake/Output this shift: Total I/O In: 0  Out: 450 [Urine:300; Emesis/NG output:150]  PE: Abd: soft, great BS today, NGT in place with some bilious output in the cannister, incision c/d/i with staples in place  Lab Results:  No results for input(s): WBC, HGB, HCT, PLT in the last 72 hours. BMET No results for input(s): NA, K, CL, CO2, GLUCOSE, BUN, CREATININE, CALCIUM in the last 72 hours. PT/INR No results for input(s): LABPROT, INR in the last 72 hours. CMP     Component Value Date/Time   NA 135 01/22/2018 0532   K 4.2 01/22/2018 0532   CL 99 (L) 01/22/2018 0532   CO2 24 01/22/2018 0532   GLUCOSE 162 (H) 01/22/2018 0532   BUN 10 01/22/2018 0532   CREATININE 1.01 01/22/2018 0532   CALCIUM 9.2 01/22/2018 0532   PROT 8.0 01/18/2018 0802   ALBUMIN 4.9 01/18/2018 0802   AST 22 01/18/2018 0802   ALT 22 01/18/2018 0802   ALKPHOS 55 01/18/2018 0802   BILITOT 1.0 01/18/2018 0802   GFRNONAA >60 01/22/2018 0532   GFRAA >60 01/22/2018 0532   Lipase     Component Value Date/Time   LIPASE 22 01/18/2018 0802       Studies/Results: No results found.  Anti-infectives: Anti-infectives (From admission, onward)   Start     Dose/Rate Route Frequency Ordered Stop   01/22/18 0845  ceFAZolin  (ANCEF) IVPB 2g/100 mL premix     2 g 200 mL/hr over 30 Minutes Intravenous On call to O.R. 01/22/18 0835 01/22/18 1513       Assessment/Plan POD 3, ex lap with LOA for SBO  -Post op ileus, await bowel function, but has good BS today.  Will clamp NGT and see how he tolerates this.  Will check residual around 1400 today to see if he needs to go back to suction or if we can continue clamping trials.   -be careful removing NGT as it was unable to be placed on floor or in radiology, had to be placed on in OR and still difficult to place. -cont ambulation -IS -cont IV pain meds -cont robaxin -will try to get NGT out tomorrow if he is clinically able so he can try to get an afternoon pass to go see his daughter's wedding.  Patient satisfaction people are working on this.  FEN - NPO/NGT clamped for now/IVFs VTE - Heparin/SCDs ID - ancef preop  ADDENDUM: After almost 6 hrs clamped, the patient had no residual.  Will resume NGT clamping with the hopes this can be removed tomorrow.  If so, we are working on getting transportation and a pass for the patient be able to go to his daughter's wedding tomorrow afternoon and return when it is over.  We have told him medically we  are ok with this as long as he is stable and his NGT has been removed.  LOS: 7 days    Henreitta Cea , [facility] 01/25/2018, 9:49 AM Pager: [phone]

## 2018-01-25 NOTE — Progress Notes (Signed)
Patient ambulated 50 laps so far at 7pm

## 2018-01-26 NOTE — Progress Notes (Signed)
Patient c/o abd tenderness, c/o pain to left flank and left upper shoulder.  Stated he still has not had any bowel movement.  NGT has remained clamped since yesterday.  Burped a few times, and c/o nausea but no vomiting. Pain medication given as needed.

## 2018-01-26 NOTE — Progress Notes (Signed)
Dr. Excell Seltzer called back and ordered to unclamp NGT and place back on Law intermittent suction.

## 2018-01-26 NOTE — Progress Notes (Signed)
4 Days Post-Op   Subjective: Patient developed progressive nausea, small volume emesis and distention with NG clamped.  NG placed back to suction early this morning and has drained over 200 cc of bilious fluid.  He still feels nauseated.  Some pain needing medication about every 12 hours.  No flatus or bowel movement yet.  He is obviously upset about not being able to attend his daughter's wedding.  Objective: Vital signs in last 24 hours: Temp:  [99 F (37.2 C)-99.3 F (37.4 C)] 99 F (37.2 C) (05/24 2023) Pulse Rate:  [84-89] 84 (05/24 2023) Resp:  [17-18] 17 (05/24 2023) BP: (132-135)/(81-87) 135/87 (05/24 2023) SpO2:  [97 %-98 %] 98 % (05/24 2023) Last BM Date: 01/17/18(per pt)  Intake/Output from previous day: 05/24 0701 - 05/25 0700 In: 3610 [I.V.:3500; IV Piggyback:110] Out: 0947 [Urine:1525; Emesis/NG output:150] Intake/Output this shift: No intake/output data recorded.  General appearance: alert, cooperative and no distress GI: I do not hear any bowel sounds.  Mild distention.  Soft with minimal incisional tenderness. Incision/Wound: Dressing intact with slight old bloody drainage  Lab Results:  No results for input(s): WBC, HGB, HCT, PLT in the last 72 hours. BMET No results for input(s): NA, K, CL, CO2, GLUCOSE, BUN, CREATININE, CALCIUM in the last 72 hours.   Studies/Results: No results found.  Anti-infectives: Anti-infectives (From admission, onward)   Start     Dose/Rate Route Frequency Ordered Stop   01/22/18 0845  ceFAZolin (ANCEF) IVPB 2g/100 mL premix     2 g 200 mL/hr over 30 Minutes Intravenous On call to O.R. 01/22/18 0962 01/22/18 1513      Assessment/Plan: s/p Procedure(s): EXPLORATORY LAPAROTOMY LYSIS OF ADHESIONS Apparent postoperative ileus.  Continue NG suction. Repeat lab tomorrow morning. If prolonged may need TNA.   LOS: 8 days    Edward Jolly 5/25/2019Patient ID: Edward Hoover, male   DOB: [birth year], 57 y.o.   MRN:  [id number]

## 2018-01-26 NOTE — Progress Notes (Signed)
NGT has remained clamped since yesterday at 9:am.  Patient called nurse to his room  stated he is coughing up what appears to be bilious or stomach content.  Stated he feels full, hiis is abdomen is  tender andfeels extremely nauseated, Also feels like he's going to throw up"  NGT was connected and MD Notified.

## 2018-01-26 NOTE — Progress Notes (Signed)
rm Edward, Hoover, SBO 5/21 s/p ExpLap lysis of Adhesion. NGT clamped Since yesterday, but c/o Lots of nausea sml emesis unclamp for 68min.  Only 20cc out.  Reclamped.  Bec of little output.

## 2018-01-27 ENCOUNTER — Inpatient Hospital Stay: Payer: Self-pay

## 2018-01-27 ENCOUNTER — Inpatient Hospital Stay (HOSPITAL_COMMUNITY)

## 2018-01-27 LAB — BASIC METABOLIC PANEL
Anion gap: 10 (ref 5–15)
BUN: 9 mg/dL (ref 6–20)
CO2: 26 mmol/L (ref 22–32)
Calcium: 8.8 mg/dL — ABNORMAL LOW (ref 8.9–10.3)
Chloride: 100 mmol/L — ABNORMAL LOW (ref 101–111)
Creatinine, Ser: 0.89 mg/dL (ref 0.61–1.24)
GFR calc Af Amer: >60 mL/min (ref >60)
GFR calc non Af Amer: >60 mL/min (ref >60)
Glucose, Bld: 141 mg/dL — ABNORMAL HIGH (ref 65–99)
Potassium: 3.8 mmol/L (ref 3.5–5.1)
Sodium: 136 mmol/L (ref 135–145)

## 2018-01-27 LAB — CBC
HEMATOCRIT: 35.9 % — AB (ref 39.0–52.0)
HEMOGLOBIN: 12.3 g/dL — AB (ref 13.0–17.0)
MCH: 33.4 pg (ref 26.0–34.0)
MCHC: 34.3 g/dL (ref 30.0–36.0)
MCV: 97.6 fL (ref 78.0–100.0)
PLATELETS: 295 10*3/uL (ref 150–400)
RBC: 3.68 MIL/uL — AB (ref 4.22–5.81)
RDW: 11.8 % (ref 11.5–15.5)
WBC: 9.4 10*3/uL (ref 4.0–10.5)

## 2018-01-27 LAB — MAGNESIUM: Magnesium: 1.8 mg/dL (ref 1.7–2.4)

## 2018-01-27 LAB — PHOSPHORUS: Phosphorus: 3.7 mg/dL (ref 2.5–4.6)

## 2018-01-27 MED ORDER — FAT EMULSION PLANT BASED 20 % IV EMUL
240.0000 mL | INTRAVENOUS | Status: DC
  Filled 2018-01-27: qty 250

## 2018-01-27 MED ORDER — KCL IN DEXTROSE-NACL 20-5-0.45 MEQ/L-%-% IV SOLN
INTRAVENOUS | Status: AC
  Administered 2018-01-27 – 2018-01-28 (×2): via INTRAVENOUS
  Filled 2018-01-27: qty 1000

## 2018-01-27 MED ORDER — SODIUM CHLORIDE 0.9 % IV SOLN: INTRAVENOUS | Status: DC

## 2018-01-27 MED ORDER — INSULIN ASPART 100 UNIT/ML ~~LOC~~ SOLN: 0.0000 [IU] | Freq: Four times a day (QID) | SUBCUTANEOUS | Status: DC

## 2018-01-27 MED ORDER — FAMOTIDINE IN NACL 20-0.9 MG/50ML-% IV SOLN
20.0000 mg | Freq: Every day | INTRAVENOUS | Status: DC
  Administered 2018-01-27: 20 mg via INTRAVENOUS
  Filled 2018-01-27: qty 50

## 2018-01-27 MED ORDER — TRACE MINERALS CR-CU-MN-SE-ZN 10-1000-500-60 MCG/ML IV SOLN
INTRAVENOUS | Status: DC
  Filled 2018-01-27: qty 960

## 2018-01-27 MED ORDER — KCL IN DEXTROSE-NACL 20-5-0.45 MEQ/L-%-% IV SOLN: INTRAVENOUS | Status: DC

## 2018-01-27 NOTE — Progress Notes (Addendum)
PHARMACY - ADULT TOTAL PARENTERAL NUTRITION CONSULT NOTE   Pharmacy Consult for TPN Indication: prolonged ileus  Patient Measurements: Height: 5\' 11"  (180.3 cm) Weight: 179 lb (81.2 kg) IBW/kg (Calculated) : 75.3 TPN AdjBW (KG): 81.2 Body mass index is 24.97 kg/m. Usual Weight: 82 kg  Insulin Requirements: none ordered  Current Nutrition: NPO  IVF: D5-1/2 NS + 20K @  Central access: pending TPN start date: anticiptate 5/26, pending PICC placement  ASSESSMENT                                                                                                          HPI: 65 yoM with PMH diverticulitis, BPH, GERD, and SB resection as a child following traumatic accident, presents 5/17 with severe abd cramping x 24 hr. CT reveals SBO mid-ileum and diverticulosis without diverticulitis. Patient failed conservative management and eventually required ex lap with LOA on 5/21. Failed trial of NG clamping on 5/25 and as a result was unable to attend his daughter's wedding. Still without flatus or BM as of 5/26, and pharmacy consulted to begin TPN while SBO resolves.   Patient is without nutrition for 11 days as of 5/26, so will consider moderate-high risk of refeeding  Significant events:  5/21 LOA in OR 5/25 failed NG clamping 5/26 begin TPN  Today:   Glucose - SBGs mostly well controlled prior to initiating TPN; range 121-162, no Hx DM  Electrolytes - essentially WNL, add on Mg, Phos pending  Renal - AKI on admission now resolved  LFTs - WNL on admission  TGs - pending  Prealbumin - pending  NUTRITIONAL GOALS                                                                                             RD recs: pending  PLAN                                                                                                                         At 1800 today: (if PICC placed, otherwise will start tomorrow, 5/27)  Start Clinimix E 5/15 at 40 ml/hr  20% fat emulsion at 20 ml/hr x  12 hr  Plan to advance as tolerated to the goal rate  Add 20 mg/d famotidine to TPN to replace IVPB  TPN to contain standard multivitamins and trace elements.  Reduce IVF to 75 ml/hr, will remove dextrose and KCl  Add sensitive SSI with q6h CBG checks   TPN lab panels on Mondays & Thursdays.  F/u daily  Reuel Boom, PharmD, BCPS [phone] 01/27/2018, 9:35 AM   ADDENDUM Patient refusing PICC - will cancel TPN, SSI orders, reorder IVF and Pepcid as before. Will leave labs and other orders in place as I expect we will be able to proceed with PICC and TPN tomorrow.  Reuel Boom, PharmD, BCPS [phone] 01/27/2018, 3:41 PM

## 2018-01-27 NOTE — Progress Notes (Signed)
Patient ID: Edward Hoover, male   DOB: 02/24/1961, 57 y.o.   MRN: [id number] 5 Days Post-Op   Subjective: No significant change today.  No flatus or bowel movements.  Having some cramps and abdominal pain but taking pain medicine more to relax than for pain.  Up walking in halls.  He is understandably frustrated with lack of progress.  Objective: Vital signs in last 24 hours: Temp:  [98.7 F (37.1 C)-99.3 F (37.4 C)] 98.7 F (37.1 C) (05/26 0607) Pulse Rate:  [82-88] 82 (05/26 0607) Resp:  [13-18] 18 (05/26 0607) BP: (128-139)/(80-88) 135/87 (05/26 0607) SpO2:  [95 %-97 %] 96 % (05/26 0607) Last BM Date: 01/17/18  Intake/Output from previous day: 05/25 0701 - 05/26 0700 In: 90 [P.O.:90] Out: 2950 [Urine:1350; Emesis/NG output:1600] Intake/Output this shift: Total I/O In: -  Out: 350 [Urine:350]  General appearance: alert, cooperative and no distress GI: Minimal bowel sounds.  Mild distention.  Soft and without significant tenderness. Incision/Wound: Dressing clean and dry  Lab Results:  Recent Labs    01/27/18 0435  WBC 9.4  HGB 12.3*  HCT 35.9*  PLT 295   BMET Recent Labs    01/27/18 0435  NA 136  K 3.8  CL 100*  CO2 26  GLUCOSE 141*  BUN 9  CREATININE 0.89  CALCIUM 8.8*     Studies/Results: [place] Ekg Site Rite  Result Date: 01/27/2018 If Site Rite image not attached, placement could not be confirmed due to current cardiac rhythm.   Anti-infectives: Anti-infectives (From admission, onward)   Start     Dose/Rate Route Frequency Ordered Stop   01/22/18 0845  ceFAZolin (ANCEF) IVPB 2g/100 mL premix     2 g 200 mL/hr over 30 Minutes Intravenous On call to O.R. 01/22/18 8144 01/22/18 1513      Assessment/Plan: s/p Procedure(s): EXPLORATORY LAPAROTOMY LYSIS OF ADHESIONS Postoperative ileus.  He is approaching 2 weeks without nutrition.  We will place a PIC and start TNA.  Check abdominal x-rays today.  Continue NG suction.  Encourage patient to  minimize narcotic use and if he needs sedation to use the Ativan.  All questions answered.   LOS: 9 days    Edward Jolly 01/27/2018

## 2018-01-27 NOTE — Progress Notes (Signed)
Spoke with pt and wife at great length re PICC placement. Approximately  An hour at bedside.  Answered all questions and concerns and alternatives.  Pt is refusing PICC at this time for nutritional needs.  States wants to speak with physician in am in further detail and will make his decision.  Wife states she understands he needs this PICC line and wishes he would proceed.  Wife also understands he feels "out of control of his body and events with hospitalization", but that he needs to be able to make the decision in his own time.  Dr Excell Seltzer, pharmacy and RN notified of pts decision.  Pt cooperative and pleasant.

## 2018-01-28 LAB — DIFFERENTIAL
Basophils Absolute: 0 10*3/uL (ref 0.0–0.1)
Basophils Relative: 0 %
Eosinophils Absolute: 0.2 10*3/uL (ref 0.0–0.7)
Eosinophils Relative: 2 %
LYMPHS PCT: 23 %
Lymphs Abs: 2 10*3/uL (ref 0.7–4.0)
MONO ABS: 0.9 10*3/uL (ref 0.1–1.0)
MONOS PCT: 10 %
NEUTROS ABS: 5.7 10*3/uL (ref 1.7–7.7)
NEUTROS PCT: 65 %

## 2018-01-28 LAB — CBC
HCT: 37.6 % — ABNORMAL LOW (ref 39.0–52.0)
Hemoglobin: 13 g/dL (ref 13.0–17.0)
MCH: 33.6 pg (ref 26.0–34.0)
MCHC: 34.6 g/dL (ref 30.0–36.0)
MCV: 97.2 fL (ref 78.0–100.0)
PLATELETS: 352 10*3/uL (ref 150–400)
RBC: 3.87 MIL/uL — ABNORMAL LOW (ref 4.22–5.81)
RDW: 11.8 % (ref 11.5–15.5)
WBC: 8.8 10*3/uL (ref 4.0–10.5)

## 2018-01-28 LAB — COMPREHENSIVE METABOLIC PANEL
ALBUMIN: 3.1 g/dL — AB (ref 3.5–5.0)
ALK PHOS: 92 U/L (ref 38–126)
ALT: 73 U/L — ABNORMAL HIGH (ref 17–63)
ANION GAP: 10 (ref 5–15)
AST: 36 U/L (ref 15–41)
BILIRUBIN TOTAL: 0.8 mg/dL (ref 0.3–1.2)
BUN: 10 mg/dL (ref 6–20)
CALCIUM: 9 mg/dL (ref 8.9–10.3)
CO2: 28 mmol/L (ref 22–32)
Chloride: 99 mmol/L — ABNORMAL LOW (ref 101–111)
Creatinine, Ser: 0.94 mg/dL (ref 0.61–1.24)
GFR calc Af Amer: >60 mL/min (ref >60)
GFR calc non Af Amer: >60 mL/min (ref >60)
Glucose, Bld: 130 mg/dL — ABNORMAL HIGH (ref 65–99)
POTASSIUM: 4.1 mmol/L (ref 3.5–5.1)
SODIUM: 137 mmol/L (ref 135–145)
TOTAL PROTEIN: 6.8 g/dL (ref 6.5–8.1)

## 2018-01-28 LAB — PHOSPHORUS: PHOSPHORUS: 3.6 mg/dL (ref 2.5–4.6)

## 2018-01-28 LAB — MAGNESIUM: Magnesium: 2 mg/dL (ref 1.7–2.4)

## 2018-01-28 LAB — PREALBUMIN: Prealbumin: 16.4 mg/dL — ABNORMAL LOW (ref 18–38)

## 2018-01-28 LAB — TRIGLYCERIDES: TRIGLYCERIDES: 113 mg/dL (ref <150)

## 2018-01-28 MED ORDER — INSULIN ASPART 100 UNIT/ML ~~LOC~~ SOLN
0.0000 [IU] | Freq: Four times a day (QID) | SUBCUTANEOUS | Status: DC
  Administered 2018-01-29: 1 [IU] via SUBCUTANEOUS

## 2018-01-28 MED ORDER — FAT EMULSION PLANT BASED 20 % IV EMUL
240.0000 mL | INTRAVENOUS | Status: AC
  Administered 2018-01-28: 240 mL via INTRAVENOUS
  Filled 2018-01-28: qty 250

## 2018-01-28 MED ORDER — SODIUM CHLORIDE 0.9 % IV SOLN
INTRAVENOUS | Status: DC
  Administered 2018-01-28 – 2018-01-29 (×3): via INTRAVENOUS
  Administered 2018-01-30: 75 mL/h via INTRAVENOUS

## 2018-01-28 MED ORDER — TRACE MINERALS CR-CU-MN-SE-ZN 10-1000-500-60 MCG/ML IV SOLN
INTRAVENOUS | Status: AC
  Administered 2018-01-28: 17:00:00 via INTRAVENOUS
  Filled 2018-01-28: qty 960

## 2018-01-28 MED ORDER — SODIUM CHLORIDE 0.9% FLUSH
10.0000 mL | INTRAVENOUS | Status: DC | PRN
  Administered 2018-01-29 – 2018-01-30 (×2): 10 mL
  Filled 2018-01-28 (×2): qty 40

## 2018-01-28 NOTE — Progress Notes (Signed)
[facility] Office:  [phone] General Surgery Progress Note   LOS: 10 days  POD -  6 Days Post-Op  Chief Complaint: Bowel obstruction  Assessment and Plan: 1.  EXPLORATORY LAPAROTOMY LYSIS OF ADHESIONS - 01/22/2018 - A. Thomas  For SBO  He's had a prolonged post op ileus  He's very frustrated by hospital course, but medically looks good, except for lack of bowel function.  Will clamp NGT 6 hours on/1 hours off.  I spent about 30 minutes with patient going over labs and x-rays  2.  Malnutrition - did not want PICC yesterday, but agreed to it today  Will start TPN when PICC in  place 3.  GERD 4.  BPH 5.  DVT prophylaxis - SQ Heparin   Active Problems:   Small bowel obstruction (HCC)  Subjective:  Frustrated.  No flatus or BM.  Wife in room.  Objective:   Vitals:   01/27/18 2027 01/28/18 0537  BP: 129/86 123/78  Pulse: 80 68  Resp: 17 16  Temp: 98.9 F (37.2 C) 98.4 F (36.9 C)  SpO2: 95% 97%     Intake/Output from previous day:  05/26 0701 - 05/27 0700 In: 2479.2 [I.V.:2479.2] Out: 2400 [Urine:1200; Emesis/NG output:1200]  Intake/Output this shift:  No intake/output data recorded.   Physical Exam:   General: WN WM who is alert and oriented.  Has NGT  HEENT: Normal. Pupils equal. .   Lungs: Clear   Abdomen: Soft   Wound: Clean   Lab Results:    Recent Labs    01/27/18 0435 01/28/18 0547  WBC 9.4 8.8  HGB 12.3* 13.0  HCT 35.9* 37.6*  PLT 295 352    BMET   Recent Labs    01/27/18 0435 01/28/18 0547  NA 136 137  K 3.8 4.1  CL 100* 99*  CO2 26 28  GLUCOSE 141* 130*  BUN 9 10  CREATININE 0.89 0.94  CALCIUM 8.8* 9.0    PT/INR  No results for input(s): LABPROT, INR in the last 72 hours.  ABG  No results for input(s): PHART, HCO3 in the last 72 hours.  Invalid input(s): PCO2, PO2   Studies/Results:  Dg Abd 2 Views  Result Date: 01/27/2018 CLINICAL DATA:  Cramps and abdominal pain. EXAM: ABDOMEN - 2 VIEW COMPARISON:   01/22/2018. FINDINGS: Under advanced nasogastric tube. The side port is just above the level of the GE junction. The tip of the tube is approximately 4.7 cm below the level of the GE junction. There has been decrease in small bowel distension. Enteric contrast material has progressed into the right colon. IMPRESSION: 1. The NG tube may need to be advanced further into the stomach as the side port is above the GE junction. 2. Interval improvement in small bowel dilatation with progression of enteric contrast material into the colon. Electronically Signed   By: Kerby Moors M.D.   On: 01/27/2018 11:08   [place] Ekg Site Rite  Result Date: 01/27/2018 If Site Rite image not attached, placement could not be confirmed due to current cardiac rhythm.    Anti-infectives:   Anti-infectives (From admission, onward)   Start     Dose/Rate Route Frequency Ordered Stop   01/22/18 0845  ceFAZolin (ANCEF) IVPB 2g/100 mL premix     2 g 200 mL/hr over 30 Minutes Intravenous On call to O.R. 01/22/18 0835 01/22/18 1513      Alphonsa Overall, MD, FACS Pager: [facility] Office: [phone] 01/28/2018

## 2018-01-28 NOTE — Progress Notes (Signed)
Peripherally Inserted Central Catheter/Midline Placement  The IV Nurse has discussed with the patient and/or persons authorized to consent for the patient, the purpose of this procedure and the potential benefits and risks involved with this procedure.  The benefits include less needle sticks, lab draws from the catheter, and the patient may be discharged home with the catheter. Risks include, but not limited to, infection, bleeding, blood clot (thrombus formation), and puncture of an artery; nerve damage and irregular heartbeat and possibility to perform a PICC exchange if needed/ordered by physician.  Alternatives to this procedure were also discussed.  Bard Power PICC patient education guide, fact sheet on infection prevention and patient information card has been provided to patient /or left at bedside.    PICC/Midline Placement Documentation        Edward Hoover 01/28/2018, 10:14 AM

## 2018-01-28 NOTE — Progress Notes (Signed)
Pt has changed his mind about the picc line and tpn.  I will contact the picc team to let them know.

## 2018-01-28 NOTE — Progress Notes (Signed)
[facility] NOTE   Pharmacy Consult for TPN Indication: prolonged ileus  Patient Measurements: Height: 5\' 11"  (180.3 cm) Weight: 179 lb (81.2 kg) IBW/kg (Calculated) : 75.3 TPN AdjBW (KG): 81.2 Body mass index is 24.97 kg/m. 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 CT reveals SBO mid-ileum and diverticulosis without diverticulitis. Patient failed conservative management and eventually required ex lap with LOA on 5/21. Failed trial of NG clamping on 5/25 and as a result was unable to attend his daughter's wedding. Still without flatus or BM as of 5/26, and pharmacy consulted to begin TPN while SBO resolves.   Patient is without nutrition for 11 days as of 5/26, so will consider moderate-high risk of refeeding  Significant events:  5/21 LOA in OR 5/25 failed NG clamping 5/26 begin TPN  Today:   Glucose - SBGs mostly well controlled prior to initiating TPN; range 121-162, no Hx DM  Electrolytes - wnl; Cl slightly low  Renal - AKI on admission now resolved  LFTs - WNL  TGs - wnl  Prealbumin - 16.4  NUTRITIONAL GOALS                                                                                             RD recs: Kcal:  1900-2100 Protein:  100-110g Fluid:  2L/day   Clinimix 5/15 at a goal rate of 83 ml/hr and 20% lipid emulsion x240 ml/d will provide 100 g/d protein, and 1912 kcal/d  PLAN  At 1800 today:   Start Clinimix E 5/15 at 40 ml/hr  20% fat emulsion at 20 ml/hr x 12 hr  Plan to advance as tolerated to the goal rate  Add 20 mg/d famotidine to TPN to replace IVPB  TPN to contain standard multivitamins and trace elements.  Reduce IVF to 75 ml/hr, will remove dextrose and KCl  Add sensitive SSI with q6h CBG checks   TPN lab panels on Mondays & Thursdays.  F/u daily  Reuel Boom, PharmD, BCPS [phone] 01/28/2018, 11:08 AM

## 2018-01-28 NOTE — Progress Notes (Signed)
Initial Nutrition Assessment  DOCUMENTATION CODES:   Non-severe (moderate) malnutrition in context of acute illness/injury  INTERVENTION:   Monitor magnesium, potassium, and phosphorus daily for at least 3 days, MD to replete as needed, as pt is at risk for refeeding syndrome given malnutrition status, poor PO intakes x 12 days and weight loss.  TPN per Pharmacy  NUTRITION DIAGNOSIS:   Moderate Malnutrition related to acute illness(SBO) as evidenced by percent weight loss, energy intake < 75% for > 7 days, mild fat depletion, mild muscle depletion.  GOAL:   Patient will meet greater than or equal to 90% of their needs  MONITOR:   Labs, Weight trends, I & O's(TPN)  REASON FOR ASSESSMENT:   Consult New TPN/TNA  ASSESSMENT:   57 year old male who presented to [facility] with severe abdominal cramping since 01/17/18. Reported eating chinese food one night prior but no sick contacts.  5/17: admitted for abdominal cramping 5/21: s/p ex lap w/ LOA 5/25: failed NG clamping trial  Patient in room with wife at bedside. Pt reports last consuming solid food on 5/15. Has had no PO intake for 12 days now. Pt did have one day during this admission where he ate some clear liquids but overall he has been NPO with a NGT placed. NGT still in place at this time, last recorded output was 700 ml.  Pt awaiting PICC line placement to begin TPN tonight. Pt is at refeeding syndrome risk given poor intakes, malnutrition status and weight loss.  Per patient UBW is 179 lb. RD requested that weight be measured for this admission, pt states he has not been weighed. Per RN, pt weighs 166.7 lb today. This is a 13% wt loss x 12 days.   Medications: D5 and .45% NaCl w/ Kcl infusion at 125 ml/hr -provides 510 kcal Labs reviewed: TG:113 mg/dL   NUTRITION - FOCUSED PHYSICAL EXAM:    Most Recent Value  Orbital Region  Mild depletion  Upper Arm Region  Moderate depletion  Thoracic and Lumbar Region  Unable to  assess  Buccal Region  No depletion  Temple Region  Mild depletion  Clavicle Bone Region  Mild depletion  Clavicle and Acromion Bone Region  Mild depletion  Scapular Bone Region  Mild depletion  Dorsal Hand  Mild depletion  Patellar Region  Unable to assess  Anterior Thigh Region  Unable to assess  Posterior Calf Region  Unable to assess  Edema (RD Assessment)  None       Diet Order:   Diet Order           Diet NPO time specified Except for: [employer], Other (See Comments)  Diet effective now          EDUCATION NEEDS:   Education needs have been addressed  Skin:  Skin Assessment: Skin Integrity Issues: Skin Integrity Issues:: Incisions Incisions: abdominal   Last BM:  5/16  Height:   Ht Readings from Last 1 Encounters:  01/22/18 5\' 11"  (1.803 m)    Weight:   Wt Readings from Last 1 Encounters:  01/22/18 179 lb (81.2 kg)  Weight obtained 5/27 per RN: 166.7 lb (75.7 kg)  Ideal Body Weight:  78.2 kg  BMI:  Body mass index is 24.97 kg/m.  Estimated Nutritional Needs:   Kcal:  1900-2100  Protein:  100-110g  Fluid:  2L/day   Clayton Bibles, MS, RD, LDN [place] Dietitian Pager: [phone] After Hours Pager: [phone]

## 2018-01-29 DIAGNOSIS — E440 Moderate protein-calorie malnutrition: Secondary | ICD-10-CM

## 2018-01-29 LAB — BASIC METABOLIC PANEL
ANION GAP: 10 (ref 5–15)
BUN: 15 mg/dL (ref 6–20)
CALCIUM: 8.7 mg/dL — AB (ref 8.9–10.3)
CO2: 25 mmol/L (ref 22–32)
Chloride: 101 mmol/L (ref 101–111)
Creatinine, Ser: 0.84 mg/dL (ref 0.61–1.24)
GFR calc Af Amer: >60 mL/min (ref >60)
Glucose, Bld: 146 mg/dL — ABNORMAL HIGH (ref 65–99)
POTASSIUM: 3.9 mmol/L (ref 3.5–5.1)
SODIUM: 136 mmol/L (ref 135–145)

## 2018-01-29 LAB — MAGNESIUM: MAGNESIUM: 2 mg/dL (ref 1.7–2.4)

## 2018-01-29 LAB — GLUCOSE, CAPILLARY
GLUCOSE-CAPILLARY: 124 mg/dL — AB (ref 65–99)
GLUCOSE-CAPILLARY: 121 mg/dL — AB (ref 65–99)
Glucose-Capillary: 128 mg/dL — ABNORMAL HIGH (ref 65–99)
Glucose-Capillary: 110 mg/dL — ABNORMAL HIGH (ref 65–99)

## 2018-01-29 LAB — PHOSPHORUS: Phosphorus: 3.3 mg/dL (ref 2.5–4.6)

## 2018-01-29 MED ORDER — FAT EMULSION PLANT BASED 20 % IV EMUL
240.0000 mL | INTRAVENOUS | Status: DC
  Administered 2018-01-29: 240 mL via INTRAVENOUS
  Filled 2018-01-29: qty 250

## 2018-01-29 MED ORDER — TRACE MINERALS CR-CU-MN-SE-ZN 10-1000-500-60 MCG/ML IV SOLN
INTRAVENOUS | Status: DC
  Administered 2018-01-29: 17:00:00 via INTRAVENOUS
  Filled 2018-01-29: qty 1560

## 2018-01-29 NOTE — Progress Notes (Signed)
[facility] NOTE   Pharmacy Consult for TPN Indication: prolonged ileus  Patient Measurements: Height: 5\' 11"  (180.3 cm) Weight: 179 lb (81.2 kg) IBW/kg (Calculated) : 75.3 TPN AdjBW (KG): 81.2 Body mass index is 24.97 kg/m. 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 CT reveals SBO mid-ileum and diverticulosis without diverticulitis. Patient failed conservative management and eventually required ex lap with LOA on 5/21. Failed trial of NG clamping on 5/25 and as a result was unable to attend his daughter's wedding. Still without flatus or BM as of 5/26, and pharmacy consulted to begin TPN while SBO resolves.   Patient is without nutrition for 11 days as of 5/26, so will consider moderate-high risk of refeeding  Significant events:  5/21 LOA in OR 5/25 failed NG clamping 5/27 begin TPN  Today:   Glucose - CBGs at goal <150, no Hx DM  Electrolytes - WNL  Renal - AKI on admission now resolved  LFTs - WNL  TGs - wnl  Prealbumin - 16.4  NUTRITIONAL GOALS                                                                                             RD recs: Kcal:  1900-2100 Protein:  100-110g Fluid:  2L/day   Clinimix 5/15 at a goal rate of 83 ml/hr and 20% lipid emulsion x240 ml/d will provide 100 g/d protein, and 1912 kcal/d  PLAN                                                                                                                         At 1800 today:   Continue Clinimix E 5/15 and will increase rate from  40 ml/hr to 65 ml/hr   20% fat  emulsion at 20 ml/hr x 12 hr  Plan to advance as tolerated to the goal rate  Add 20 mg/d famotidine to TPN to replace IVPB  TPN to contain standard multivitamins and trace elements.  Reduce IVF to 50 ml/hr  Cotninue sensitive SSI with q6h CBG checks   TPN lab panels on Mondays & Thursdays.   Adrian Saran, PharmD, BCPS Pager [phone] 01/29/2018 9:57 AM

## 2018-01-29 NOTE — Progress Notes (Signed)
Patient ID: Edward Hoover, male   DOB: [birth year], 57 y.o.   MRN: [id number]    7 Days Post-Op  Subjective: Patient feels better this am.  He has passed flatus 11 times and had 3 BMs.  He really wants his NGT out.  Objective: Vital signs in last 24 hours: Temp:  [98.3 F (36.8 C)-99.5 F (37.5 C)] 98.3 F (36.8 C) (05/28 0514) Pulse Rate:  [70-93] 70 (05/28 0514) Resp:  [16-20] 16 (05/28 0514) BP: (111-120)/(73-80) 117/73 (05/28 0514) SpO2:  [93 %-97 %] 97 % (05/28 0514) Last BM Date: 01/28/18  Intake/Output from previous day: 05/27 0701 - 05/28 0700 In: 1831.3 [I.V.:1781.3; IV Piggyback:50] Out: 951 [Urine:500; Emesis/NG output:450; Stool:1] Intake/Output this shift: Total I/O In: 180 [P.O.:180] Out: 175 [Urine:175]  PE: Abd: soft, +BS, appropriately tender, ND, incision c/d/i with staples present  Lab Results:  Recent Labs    01/27/18 0435 01/28/18 0547  WBC 9.4 8.8  HGB 12.3* 13.0  HCT 35.9* 37.6*  PLT 295 352   BMET Recent Labs    01/28/18 0547 01/29/18 0407  NA 137 136  K 4.1 3.9  CL 99* 101  CO2 28 25  GLUCOSE 130* 146*  BUN 10 15  CREATININE 0.94 0.84  CALCIUM 9.0 8.7*   PT/INR No results for input(s): LABPROT, INR in the last 72 hours. CMP     Component Value Date/Time   NA 136 01/29/2018 0407   K 3.9 01/29/2018 0407   CL 101 01/29/2018 0407   CO2 25 01/29/2018 0407   GLUCOSE 146 (H) 01/29/2018 0407   BUN 15 01/29/2018 0407   CREATININE 0.84 01/29/2018 0407   CALCIUM 8.7 (L) 01/29/2018 0407   PROT 6.8 01/28/2018 0547   ALBUMIN 3.1 (L) 01/28/2018 0547   AST 36 01/28/2018 0547   ALT 73 (H) 01/28/2018 0547   ALKPHOS 92 01/28/2018 0547   BILITOT 0.8 01/28/2018 0547   GFRNONAA >60 01/29/2018 0407   GFRAA >60 01/29/2018 0407   Lipase     Component Value Date/Time   LIPASE 22 01/18/2018 0802       Studies/Results: No results found.  Anti-infectives: Anti-infectives (From admission, onward)   Start     Dose/Rate Route  Frequency Ordered Stop   01/22/18 0845  ceFAZolin (ANCEF) IVPB 2g/100 mL premix     2 g 200 mL/hr over 30 Minutes Intravenous On call to O.R. 01/22/18 0835 01/22/18 1513       Assessment/Plan POD7, ex lap with LOA for SBO  -Post op ileus resolving.  Clamp NGT and allow clear liquids.  If he tolerates this then we will remove his NGT later today -cont ambulation -IS -cont IV pain meds -cont robaxin.  FEN - clear liquids/?NGT out later today VTE - Heparin/SCDs ID - ancef preop    LOS: 11 days    Henreitta Cea , [facility] 01/29/2018, 12:07 PM Pager: [phone]

## 2018-01-30 LAB — COMPREHENSIVE METABOLIC PANEL
ALBUMIN: 3 g/dL — AB (ref 3.5–5.0)
ALT: 85 U/L — AB (ref 17–63)
AST: 33 U/L (ref 15–41)
Alkaline Phosphatase: 81 U/L (ref 38–126)
Anion gap: 8 (ref 5–15)
BUN: 12 mg/dL (ref 6–20)
CO2: 25 mmol/L (ref 22–32)
CREATININE: 0.74 mg/dL (ref 0.61–1.24)
Calcium: 8.5 mg/dL — ABNORMAL LOW (ref 8.9–10.3)
Chloride: 105 mmol/L (ref 101–111)
GFR calc non Af Amer: >60 mL/min (ref >60)
GLUCOSE: 146 mg/dL — AB (ref 65–99)
Potassium: 3.8 mmol/L (ref 3.5–5.1)
Sodium: 138 mmol/L (ref 135–145)
Total Bilirubin: 0.4 mg/dL (ref 0.3–1.2)
Total Protein: 6.1 g/dL — ABNORMAL LOW (ref 6.5–8.1)

## 2018-01-30 LAB — GLUCOSE, CAPILLARY
Glucose-Capillary: 115 mg/dL — ABNORMAL HIGH (ref 65–99)
Glucose-Capillary: 117 mg/dL — ABNORMAL HIGH (ref 65–99)
Glucose-Capillary: 98 mg/dL (ref 65–99)

## 2018-01-30 MED ORDER — SODIUM CHLORIDE 0.9% FLUSH
3.0000 mL | Freq: Two times a day (BID) | INTRAVENOUS | Status: DC
  Administered 2018-01-31: 3 mL via INTRAVENOUS

## 2018-01-30 MED ORDER — ACETAMINOPHEN 325 MG PO TABS: 650.0000 mg | ORAL_TABLET | ORAL | Status: DC | PRN

## 2018-01-30 MED ORDER — METHOCARBAMOL 500 MG PO TABS
500.0000 mg | ORAL_TABLET | Freq: Three times a day (TID) | ORAL | Status: DC | PRN
  Administered 2018-01-30: 500 mg via ORAL
  Filled 2018-01-30: qty 1

## 2018-01-30 MED ORDER — SODIUM CHLORIDE 0.9 % IV SOLN: 250.0000 mL | INTRAVENOUS | Status: DC | PRN

## 2018-01-30 MED ORDER — SODIUM CHLORIDE 0.9% FLUSH: 3.0000 mL | INTRAVENOUS | Status: DC | PRN

## 2018-01-30 MED ORDER — ENSURE ENLIVE PO LIQD
237.0000 mL | Freq: Two times a day (BID) | ORAL | Status: DC
  Administered 2018-01-30: 237 mL via ORAL

## 2018-01-30 MED ORDER — TRACE MINERALS CR-CU-MN-SE-ZN 10-1000-500-60 MCG/ML IV SOLN: INTRAVENOUS | Status: DC

## 2018-01-30 MED ORDER — LACTATED RINGERS IV BOLUS: 1000.0000 mL | Freq: Three times a day (TID) | INTRAVENOUS | Status: DC | PRN

## 2018-01-30 MED ORDER — OXYCODONE HCL 5 MG PO TABS: 5.0000 mg | ORAL_TABLET | ORAL | Status: DC | PRN

## 2018-01-30 NOTE — Progress Notes (Signed)
Patient ID: Jeanett Schlein, male   DOB: [birth year], 57 y.o.   MRN: [id number]    8 Days Post-Op  Subjective: Pt feels well this morning.  Continues to pass flatus and have BMs.  Tolerating clear liquids with no issues.  Objective: Vital signs in last 24 hours: Temp:  [97.8 F (36.6 C)-98.5 F (36.9 C)] 98.4 F (36.9 C) (05/29 0550) Pulse Rate:  [72-91] 72 (05/29 0550) Resp:  [14] 14 (05/28 1355) BP: (117-124)/(70-73) 124/71 (05/29 0550) SpO2:  [96 %-98 %] 96 % (05/29 0550) Last BM Date: 01/30/18  Intake/Output from previous day: 05/28 0701 - 05/29 0700 In: 3543.8 [P.O.:590; I.V.:2953.8] Out: 725 [Urine:725] Intake/Output this shift: Total I/O In: -  Out: 450 [Urine:450]  PE: Abd: soft, minimally tender, +BS, ND, incision c/d/i with staples  Lab Results:  Recent Labs    01/28/18 0547  WBC 8.8  HGB 13.0  HCT 37.6*  PLT 352   BMET Recent Labs    01/29/18 0407 01/30/18 0443  NA 136 138  K 3.9 3.8  CL 101 105  CO2 25 25  GLUCOSE 146* 146*  BUN 15 12  CREATININE 0.84 0.74  CALCIUM 8.7* 8.5*   PT/INR No results for input(s): LABPROT, INR in the last 72 hours. CMP     Component Value Date/Time   NA 138 01/30/2018 0443   K 3.8 01/30/2018 0443   CL 105 01/30/2018 0443   CO2 25 01/30/2018 0443   GLUCOSE 146 (H) 01/30/2018 0443   BUN 12 01/30/2018 0443   CREATININE 0.74 01/30/2018 0443   CALCIUM 8.5 (L) 01/30/2018 0443   PROT 6.1 (L) 01/30/2018 0443   ALBUMIN 3.0 (L) 01/30/2018 0443   AST 33 01/30/2018 0443   ALT 85 (H) 01/30/2018 0443   ALKPHOS 81 01/30/2018 0443   BILITOT 0.4 01/30/2018 0443   GFRNONAA >60 01/30/2018 0443   GFRAA >60 01/30/2018 0443   Lipase     Component Value Date/Time   LIPASE 22 01/18/2018 0802       Studies/Results: No results found.  Anti-infectives: Anti-infectives (From admission, onward)   Start     Dose/Rate Route Frequency Ordered Stop   01/22/18 0845  ceFAZolin (ANCEF) IVPB 2g/100 mL premix     2 g 200  mL/hr over 30 Minutes Intravenous On call to O.R. 01/22/18 0835 01/22/18 1513       Assessment/Plan POD8, ex lap with LOA for SBO  -Post op ileus resolved.  Advance to fulls today. -add BID ensure -wean TNA to off today -cont ambulation -IS -oral medications  FEN - full liquids/ensure/wean TNA to off today VTE - Heparin/SCDs ID - ancef preop    LOS: 12 days    Henreitta Cea , [facility] 01/30/2018, 9:42 AM Pager: [phone]

## 2018-01-30 NOTE — Progress Notes (Signed)
Nutrition Follow-up  DOCUMENTATION CODES:   Non-severe (moderate) malnutrition in context of acute illness/injury  INTERVENTION:   -TPN per Pharmacy -weaning off today per surgery -Continue Ensure Enlive po BID, each supplement provides 350 kcal and 20 grams of protein  NUTRITION DIAGNOSIS:   Moderate Malnutrition related to acute illness(SBO) as evidenced by percent weight loss, energy intake < 75% for > 7 days, mild fat depletion, mild muscle depletion.  Ongoing.  GOAL:   Patient will meet greater than or equal to 90% of their needs  Progressing.  MONITOR:   Labs, Weight trends, I & O's(TPN)  ASSESSMENT:   57 year old male who presented to [facility] with severe abdominal cramping since 01/17/18. Reported eating chinese food one night prior but no sick contacts.  5/17: admitted for abdominal cramping 5/21: s/p ex lap w/ LOA 5/25: failed NG clamping trial 5/27: TPN started 5/28: NGT removed.  5/19: Having BMs. TPN to be weaned off today.  Patient now on full liquid diet. Tolerated clears yesterday. Ensure supplements have been added.   Labs reviewed. Medications reviewed:  Diet Order:   Diet Order           Diet full liquid Room service appropriate? Yes; Fluid consistency: Thin  Diet effective now          EDUCATION NEEDS:   Education needs have been addressed  Skin:  Skin Assessment: Skin Integrity Issues: Skin Integrity Issues:: Incisions Incisions: abdominal   Last BM:  5/16  Height:   Ht Readings from Last 1 Encounters:  01/22/18 5\' 11"  (1.803 m)    Weight:   Wt Readings from Last 1 Encounters:  01/22/18 179 lb (81.2 kg)    Ideal Body Weight:  78.2 kg  BMI:  Body mass index is 24.97 kg/m.  Estimated Nutritional Needs:   Kcal:  1900-2100  Protein:  100-110g  Fluid:  2L/day  Clayton Bibles, MS, RD, LDN [place] Dietitian Pager: [phone] After Hours Pager: [phone]

## 2018-01-31 MED ORDER — METHOCARBAMOL 500 MG PO TABS: 500.0000 mg | ORAL_TABLET | Freq: Three times a day (TID) | ORAL | 0 refills | Status: DC | PRN

## 2018-01-31 MED ORDER — OXYCODONE HCL 5 MG PO TABS: 5.0000 mg | ORAL_TABLET | Freq: Four times a day (QID) | ORAL | 0 refills | Status: DC | PRN

## 2018-01-31 NOTE — Progress Notes (Signed)
PICC d/c per order for d/c home.  No signs of infection noted. Site cleaned with CHG, covered with vaseline guaze and dry 2x2.,  No bleeding noted.  Pt verbalizes understanding and interventions of bleeding and infection via teachback method.  No AND.

## 2018-01-31 NOTE — Discharge Summary (Signed)
Physician Discharge Summary  Patient ID: Edward Hoover MRN: [id number] DOB/AGE: 56-Sep-1962 56 y.o.  Admit date: 01/18/2018 Discharge date: 01/31/2018  Admission Diagnoses: SBO  Discharge Diagnoses:  Active Problems:   Small bowel obstruction (HCC)   Malnutrition of moderate degree   Discharged Condition: good  Hospital Course: Pt admitted with SBO with h/o pediatric surgery.  He was unable to get an NG placed and his obstruction worsened.  He was taken to the OR emergently.  Ex lap and LOA was performed.  His diet was advanced once his ileus resolved.  He is now tolerating a diet and his pain is controlled.  He is ready for discharge.   Consults: None  Significant Diagnostic Studies: labs: cbc, bmet  Treatments: IV hydration, analgesia: acetaminophen and surgery: ex lap, lysis of adhesions  Discharge Exam: Blood pressure 115/76, pulse 92, temperature 97.9 F (36.6 C), temperature source Oral, resp. rate 18, height 5\' 11"  (1.803 m), weight 81.2 kg (179 lb), SpO2 99 %. General appearance: alert and cooperative GI: normal findings: soft, non-tender Incision/Wound: clean, dry, intact  Disposition:  Home   Follow-up Information    Cox, Kirsten, MD. Call in 3 days.   Specialties:  Family Medicine, Interventional Cardiology, Radiology, Anesthesiology Contact information: [place] [phone]           Signed: Rosario Adie 3/79/4327, 6:14 AM

## 2018-01-31 NOTE — Plan of Care (Signed)
Pt alert and oriented, tolerating diet well. Some bloating after eating, but having BM's and passing gas. No complaints of pain.  RN will monitor.

## 2019-07-24 IMAGING — DX DG ABDOMEN 2V
3 series · 3 of 3 positions shown · non-contrast
Comparison: Abdominal CT 01/18/2018

CLINICAL DATA: Known small bowel obstruction

EXAM:
ABDOMEN - 2 VIEW

[abdomen erect]
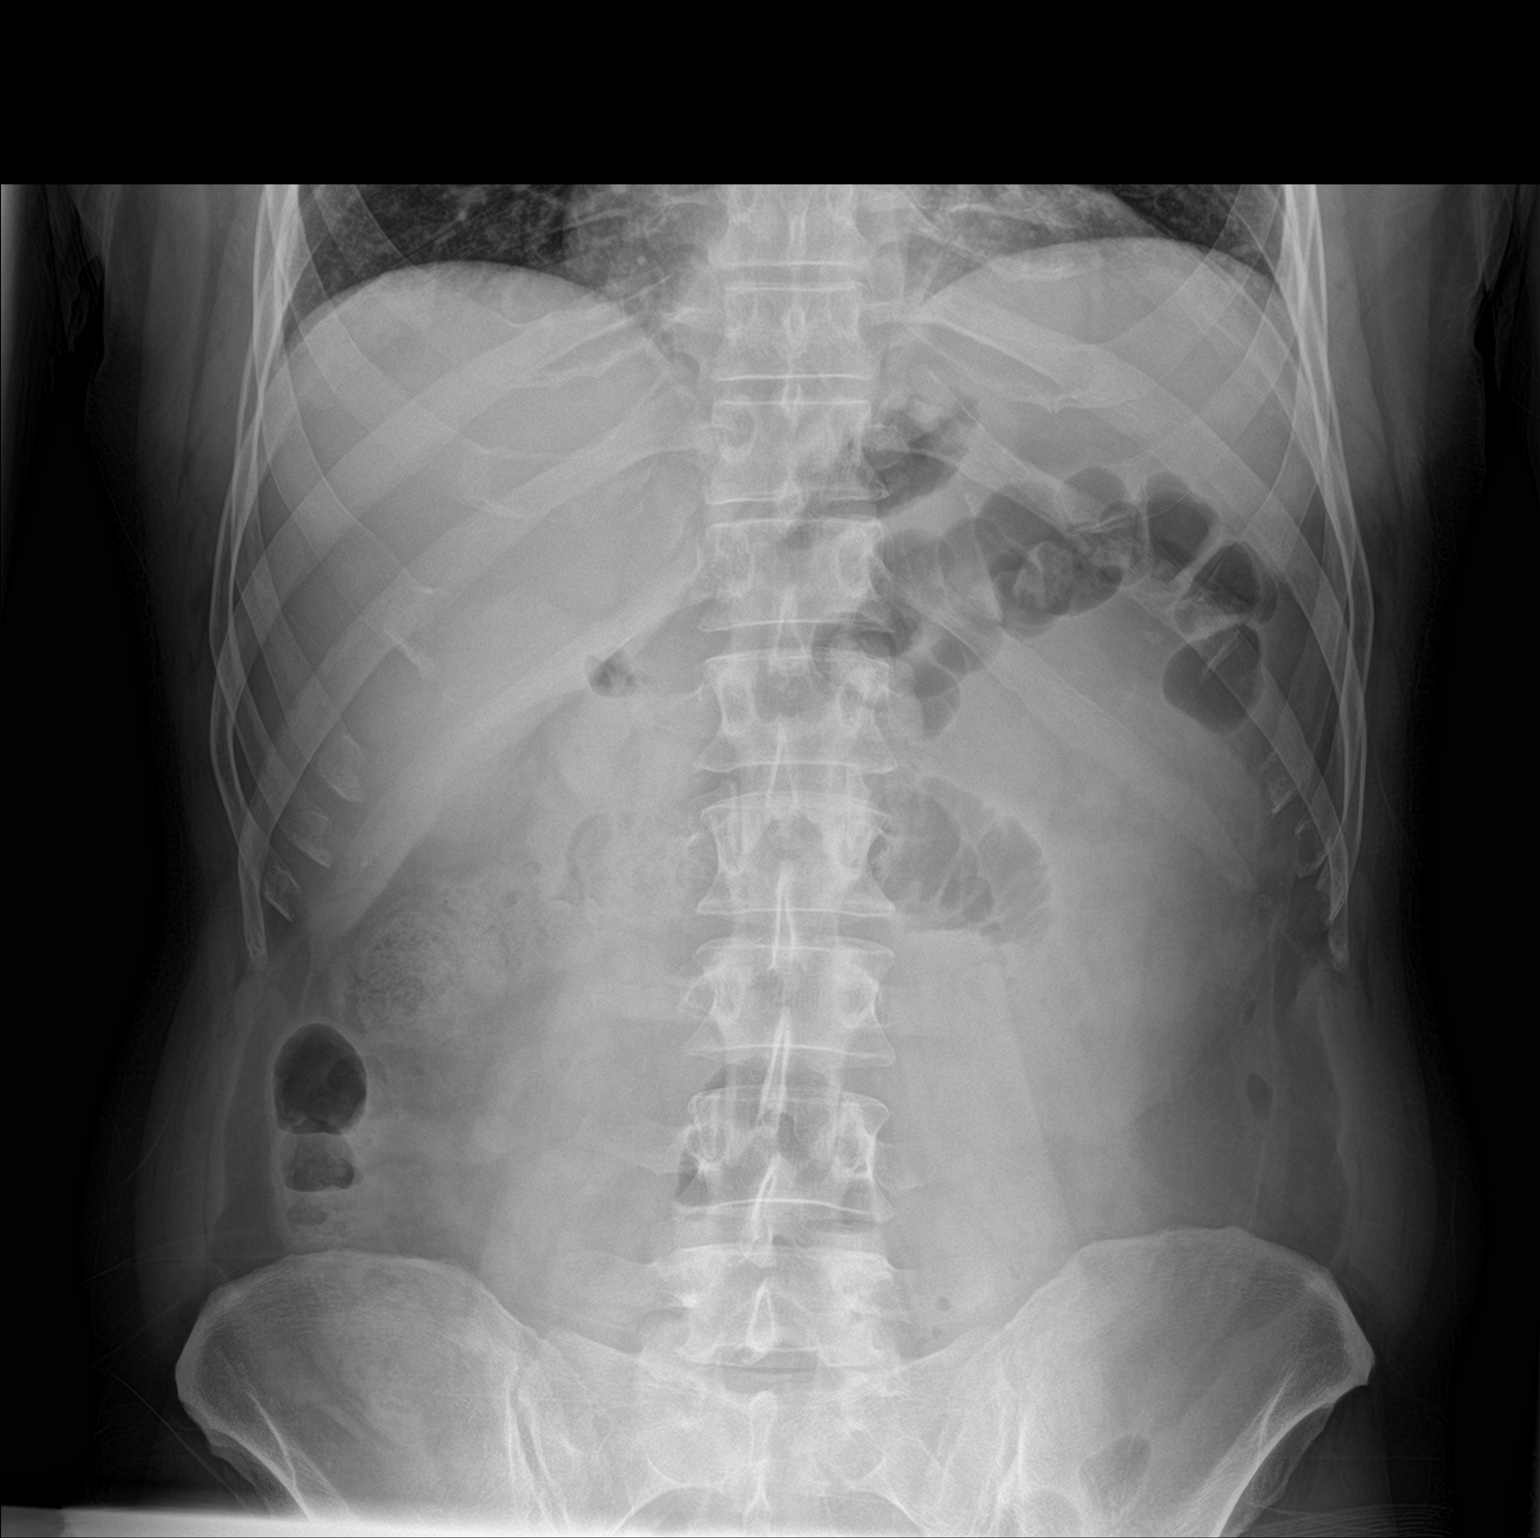

[abdomen supine (1 of 2)]
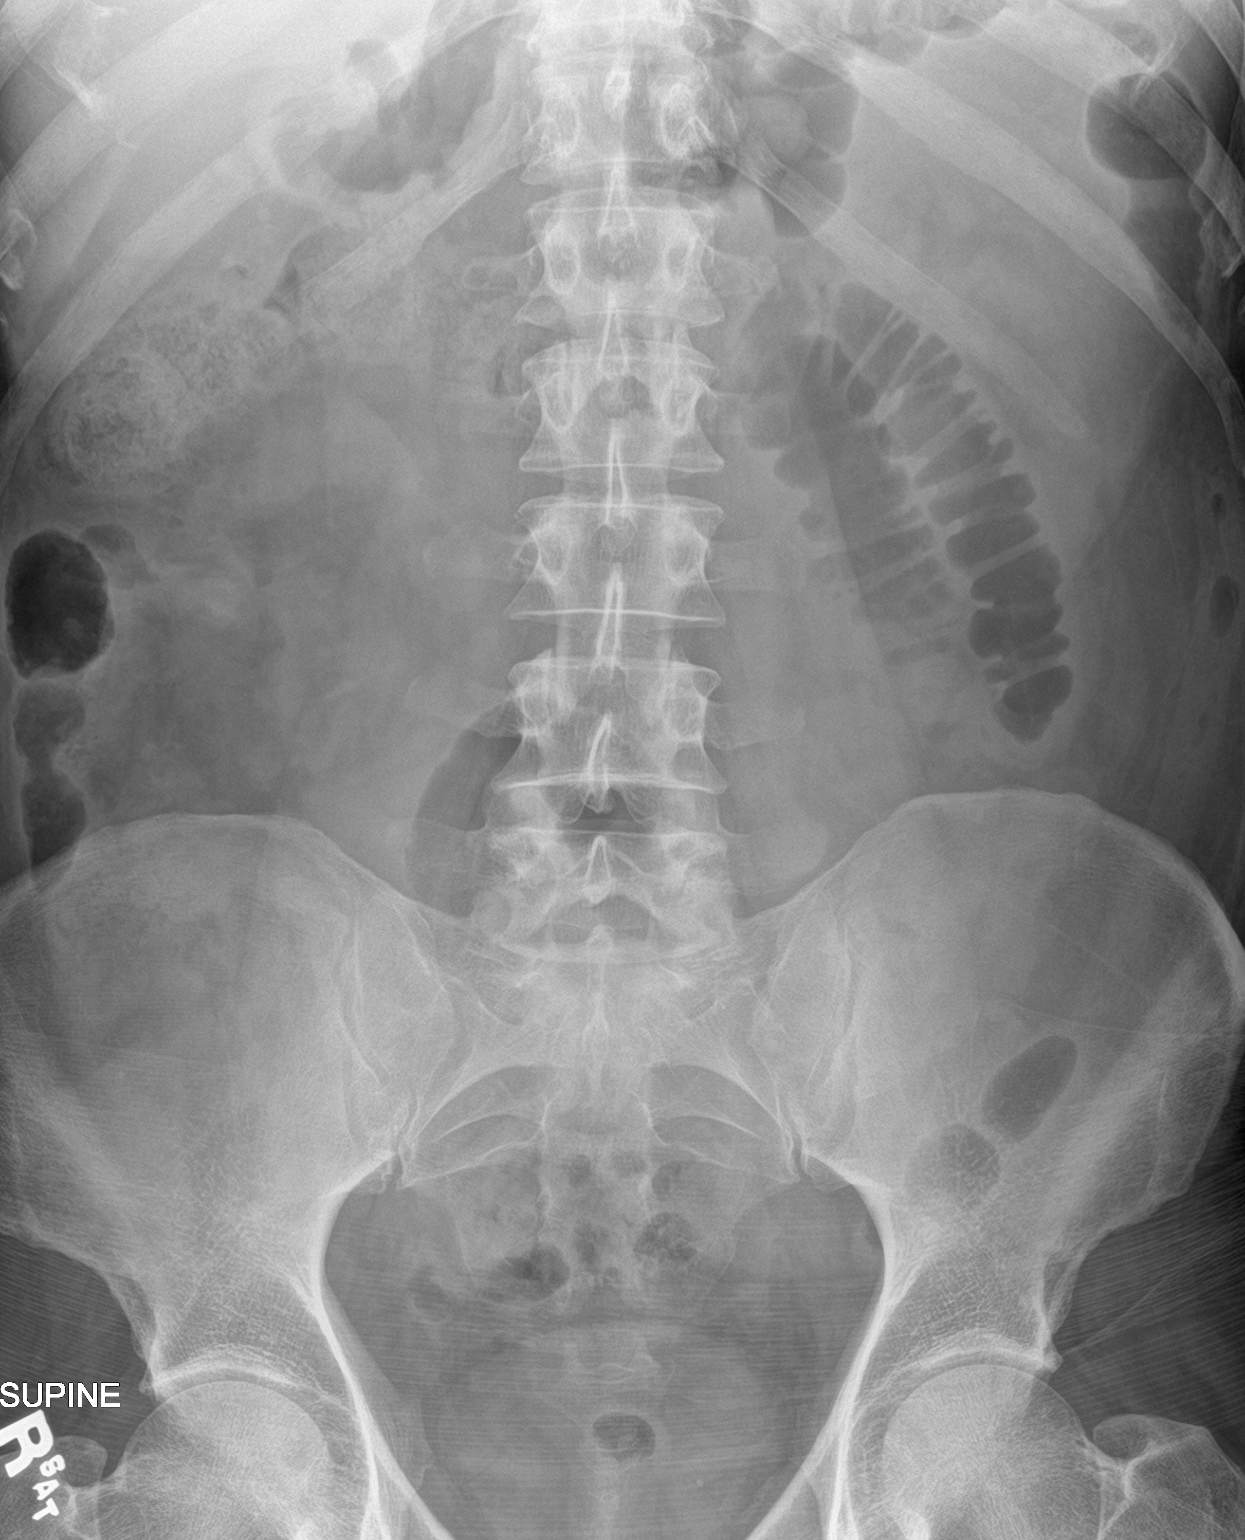

[abdomen supine (2 of 2)]
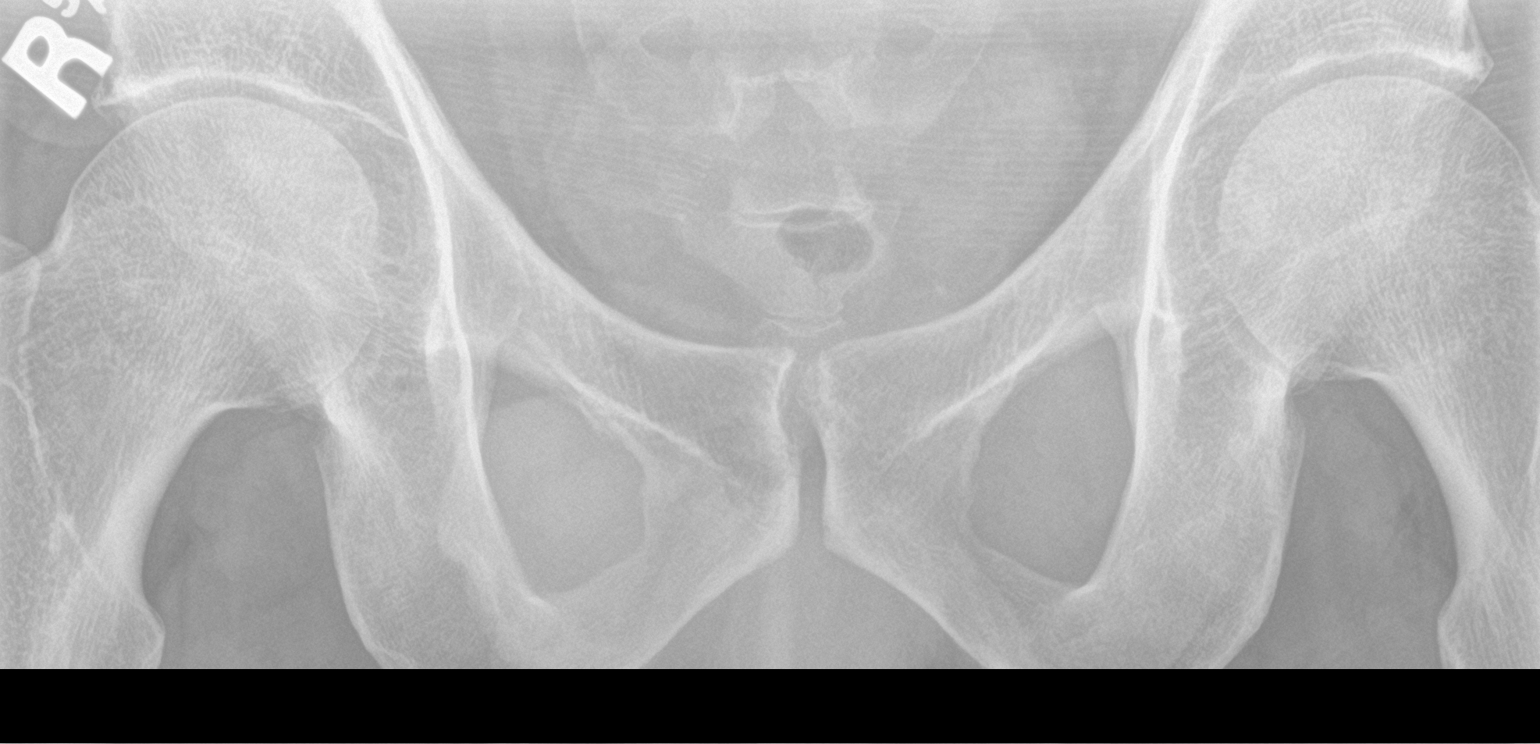

[3 of 3 positions shown; findings below may reference images not displayed]

FINDINGS: Dilated loop of small bowel in the left abdomen with mild fold
thickening. No convincing improvement from yesterday. Gas and stool
intermittently seen along the colon compatible with early or partial
obstruction. No concerning mass effect or gas collection.
IMPRESSION: Known small bowel obstruction by CT. Mild small bowel distention and
fold thickening with detected improvement from yesterday.

## 2019-07-27 IMAGING — DX DG ABD PORTABLE 1V
1 series · 1 of 1 positions shown · IV contrast (agent unspecified)
Comparison: Radiographs earlier this day.

CLINICAL DATA: Small bowel obstruction, 8 hour post contrast exam.

EXAM:
PORTABLE ABDOMEN - 1 VIEW

[abdomen kub]
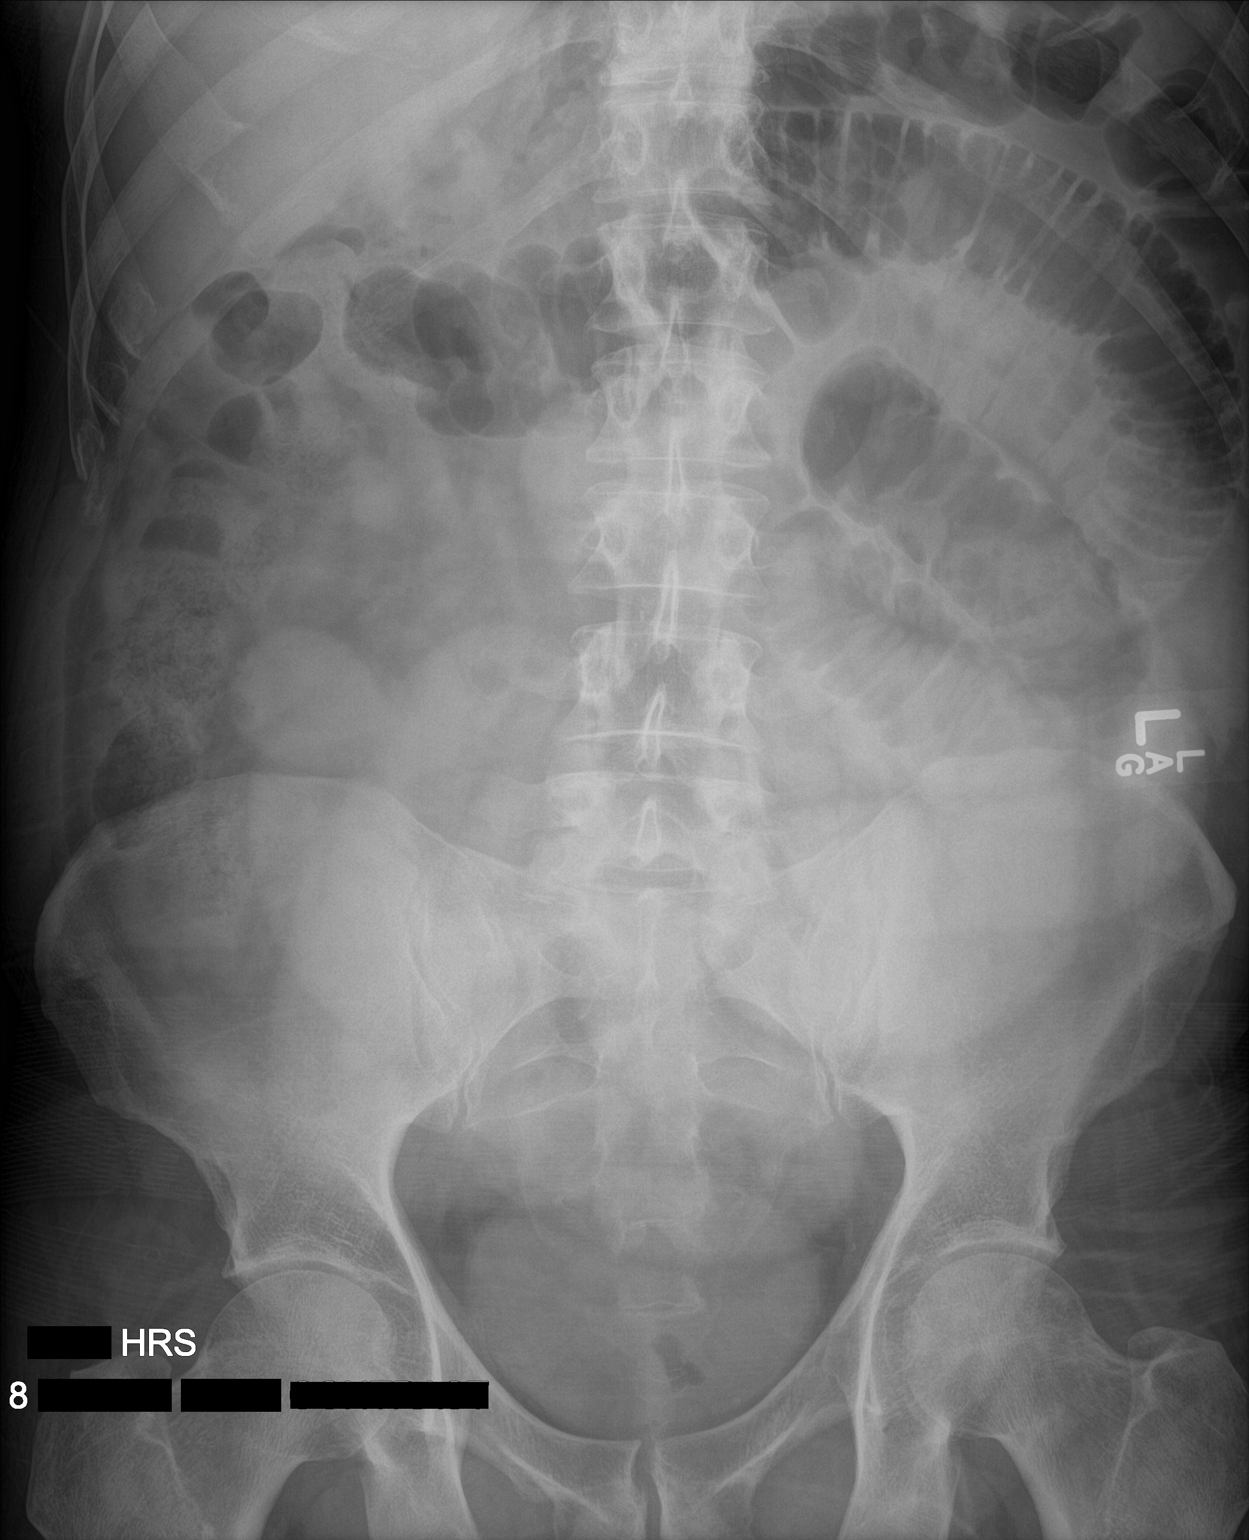

[1 of 1 positions shown; findings below may reference images not displayed]

FINDINGS: Administered enteric contrast remains in small bowel. There is no
enteric contrast in the colon. Persistent small bowel dilatation the
small bowel diameter measuring up to 4 cm.
IMPRESSION: Persistent small bowel obstruction with no enteric contrast in the
colon on 8 hour delayed film. Recommend 24 hour post contrast exam.

## 2019-10-20 ENCOUNTER — Encounter: Admitting: Family Medicine

## 2019-10-20 ENCOUNTER — Other Ambulatory Visit: Payer: Self-pay | Admitting: Family Medicine

## 2019-11-17 NOTE — Progress Notes (Addendum)
Established Patient Office Visit  Subjective:  Patient ID: Edward Hoover, male    DOB: 09-21-1960  Age: 59 y.o. MRN: [id number]  CC:  Chief Complaint  Patient presents with  . Annual Exam    HPI Edward Hoover presents for annual physical. .   Encounter for general adult medical examination without abnormal findings  Physical ("At Risk" items are starred): Patient's last physical exam was 1 year ago .  Weight: Appropriate for height (BMI less than 27%) ;  Blood Pressure: Normal (BP less than 120/80) ;  Medical History: Patient history reviewed ; Family history reviewed ;  Allergies Reviewed: No change in current allergies ;  Medications Reviewed: Medications reviewed - no changes ;  Lipids: Normal lipid levels ;  Smoking: Life-long non-smoker ;  Physical Activity: Exercises at least 3 times per week (at work);  Alcohol/Drug Use: drinks 3 drinks per week. ; No illicit drug use ;  Patient is not afflicted from Stress Incontinence and Urge Incontinence  Safety: reviewed ; Patient wears a seat belt, has smoke detectors, has carbon monoxide detectors, practices appropriate gun safety, and wears sunscreen with extended sun exposure. Dental Care: biannual cleanings, brushes and flosses daily. Ophthalmology/Optometry: Annual visit.  Hearing loss: abnormal. 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 Also has tinnitis and would like to see ENT (Dr Vicie Mutters, MD with [facility] impairments: Eye exam [facility] in 08/2019.  Past Medical History:  Diagnosis Date  . Adjustment insomnia   . Asthma   . BCC (basal cell carcinoma), face   . Depression   . Diverticulitis   . Enlarged prostate   . GERD (gastroesophageal reflux disease)     Past Surgical History:  Procedure Laterality Date  . ABDOMINAL SURGERY    . CATARACT EXTRACTION, BILATERAL    . LAPAROTOMY N/A 01/22/2018   Procedure: EXPLORATORY LAPAROTOMY LYSIS OF ADHESIONS;  Surgeon: Leighton Ruff, MD;  Location: [facility];   Service: General;  Laterality: N/A;  . SHOULDER SURGERY      Family History  Problem Relation Age of Onset  . Diabetes Other   . Hypertension Other   . CAD Other     Social History   Socioeconomic History  . Marital status: Married    Spouse name: Not on file  . Number of children: Not on file  . Years of education: Not on file  . Highest education level: Not on file  Occupational History  . Not on file  Tobacco Use  . Smoking status: Never Smoker  . Smokeless tobacco: Current User  Substance and Sexual Activity  . Alcohol use: Yes    Alcohol/week: 3.0 standard drinks    Types: 3 Cans of beer per week    Comment: daily   . Drug use: No  . Sexual activity: Not on file  Other Topics Concern  . Not on file  Social History Narrative  . Not on file   Social Determinants of Health   Financial Resource Strain:   . Difficulty of Paying Living Expenses:   Food Insecurity:   . Worried About Charity fundraiser in the Last Year:   . Arboriculturist in the Last Year:   Transportation Needs:   . Film/video editor (Medical):   Marland Kitchen Lack of Transportation (Non-Medical):   Physical Activity:   . Days of Exercise per Week:   . Minutes of Exercise per Session:   Stress:   . Feeling of Stress :  Social Connections:   . Frequency of Communication with Friends and Family:   . Frequency of Social Gatherings with Friends and Family:   . Attends Religious Services:   . Active Member of Clubs or Organizations:   . Attends Archivist Meetings:   Marland Kitchen Marital Status:   Intimate Partner Violence:   . Fear of Current or Ex-Partner:   . Emotionally Abused:   Marland Kitchen Physically Abused:   . Sexually Abused:     Family History  Problem Relation Age of Onset  . Diabetes Other   . Hypertension Other   . CAD Other     Allergies  Allergen Reactions  . Nsaids Anaphylaxis    ROS Review of Systems  Constitutional: Negative for chills, diaphoresis, fatigue and fever.  HENT:  Negative for congestion, ear pain and sore throat.   Respiratory: Negative for cough and shortness of breath.   Cardiovascular: Negative for chest pain.  Gastrointestinal: Negative for abdominal pain, constipation, diarrhea, nausea and vomiting.  Genitourinary: Negative for dysuria and urgency.  Musculoskeletal: Negative for arthralgias, joint swelling and myalgias.  Neurological: Negative for dizziness, light-headedness and headaches.  Psychiatric/Behavioral: Negative for dysphoric mood. The patient is not nervous/anxious.       Objective:    Physical Exam  Constitutional: He appears well-developed and well-nourished.  HENT:  Right Ear: Tympanic membrane and external ear normal.  Left Ear: Tympanic membrane and external ear normal.  Nose: No mucosal edema, rhinorrhea or sinus tenderness. Right sinus exhibits no maxillary sinus tenderness and no frontal sinus tenderness. Left sinus exhibits no maxillary sinus tenderness and no frontal sinus tenderness.  Mouth/Throat: No oropharyngeal exudate, posterior oropharyngeal edema or posterior oropharyngeal erythema.  Eyes: Conjunctivae and EOM are normal.  Cardiovascular: Normal rate, regular rhythm, normal heart sounds and intact distal pulses.  Pulmonary/Chest: Effort normal and breath sounds normal.  Musculoskeletal:     Cervical back: Normal range of motion.  Neurological: He is alert.  Psychiatric:  Anxiety. Stressed out. Not depressed.     BP 116/70   Pulse 97   Temp (!) 97.3 F (36.3 C)   Resp 16   Ht 5\' 10"  (1.778 m)   Wt 188 lb 6.4 oz (85.5 kg)   BMI 27.03 kg/m  Wt Readings from Last 3 Encounters:  11/18/19 188 lb 6.4 oz (85.5 kg)  10/21/16 183 lb (83 kg)  05/10/16 177 lb (80.3 kg)     Health Maintenance Due  Topic Date Due  . Hepatitis C Screening  Never done  . HIV Screening  Never done  . TETANUS/TDAP  Never done  . COLONOSCOPY  03/20/2018  . INFLUENZA VACCINE  Never done    There are no preventive care  reminders to display for this patient.  Lab Results  Component Value Date   TSH 0.772 11/18/2019   Lab Results  Component Value Date   WBC 7.6 11/18/2019   HGB 15.2 11/18/2019   HCT 44.6 11/18/2019   MCV 98 (H) 11/18/2019   PLT 272 11/18/2019   Lab Results  Component Value Date   NA 140 11/18/2019   K 4.4 11/18/2019   CO2 28 11/18/2019   GLUCOSE 78 11/18/2019   BUN 14 11/18/2019   CREATININE 1.08 11/18/2019   BILITOT 0.4 11/18/2019   ALKPHOS 65 11/18/2019   AST 19 11/18/2019   ALT 28 11/18/2019   PROT 6.9 11/18/2019   ALBUMIN 4.5 11/18/2019   CALCIUM 9.6 11/18/2019   ANIONGAP 8 01/30/2018   Lab  Results  Component Value Date   CHOL 187 10/19/2006   Lab Results  Component Value Date   HDL 38.8 (L) 10/19/2006   Lab Results  Component Value Date   LDLCALC 131 (H) 10/19/2006   Lab Results  Component Value Date   TRIG 113 01/28/2018   Lab Results  Component Value Date   CHOLHDL 4.8 CALC 10/19/2006   No results found for: HGBA1C    Assessment & Plan:  Annual Physical - Eat healthy and exercise regularly.  Recommend taking time to relax from the business of the world.   Hearing loss associated with syndrome of both ears Refer to ENT.   Prostate cancer screening Check PSA level. Refer to urology for elevated PSA that came back at 7+.  Other chest pain Likely due to stress.  Severity of chest pain is concerning however briefness and the situation is not consistent with angina.  EKG is normal.  Check troponin just in case. EKG NSR.  Tinnitus of both ears Trial of bioflavonoids OTC.  Refer to ENT.  Hearing Loss - refer to ENT.  Follow-up: Return in about 3 months (around 02/18/2020) for fasting.    Rochel Brome, MD

## 2019-11-18 ENCOUNTER — Other Ambulatory Visit: Payer: Self-pay

## 2019-11-18 ENCOUNTER — Encounter: Payer: Self-pay | Admitting: Family Medicine

## 2019-11-18 ENCOUNTER — Ambulatory Visit (INDEPENDENT_AMBULATORY_CARE_PROVIDER_SITE_OTHER): Admitting: Family Medicine

## 2019-11-18 VITALS — BP 116/70 | HR 97 | Temp 97.3°F | Resp 16 | Ht 70.0 in | Wt 188.4 lb

## 2019-11-18 DIAGNOSIS — H9313 Tinnitus, bilateral: Secondary | ICD-10-CM | POA: Diagnosis not present

## 2019-11-18 DIAGNOSIS — H9193 Unspecified hearing loss, bilateral: Secondary | ICD-10-CM

## 2019-11-18 DIAGNOSIS — R0789 Other chest pain: Secondary | ICD-10-CM | POA: Diagnosis not present

## 2019-11-18 DIAGNOSIS — Z125 Encounter for screening for malignant neoplasm of prostate: Secondary | ICD-10-CM | POA: Diagnosis not present

## 2019-11-18 DIAGNOSIS — Z0001 Encounter for general adult medical examination with abnormal findings: Secondary | ICD-10-CM

## 2019-11-18 NOTE — Assessment & Plan Note (Signed)
Trial of bioflavonoids OTC.  Refer to ENT.

## 2019-11-18 NOTE — Patient Instructions (Signed)
Plan: 1) Chest pain: EKG normal. Ordering troponin stat. If chest pain recurs, pt to call 911.  2) Tinnitus (Ringing in ears) Recommend otc bioflavanoids. Refer to ENT.  3) Recommend get advanced directives done.   Preventive Care 23-58 Years Old, Male Preventive care refers to lifestyle choices and visits with your health care provider that can promote health and wellness. This includes:  A yearly physical exam. This is also called an annual well check.  Regular dental and eye exams.  Immunizations.  Screening for certain conditions.  Healthy lifestyle choices, such as eating a healthy diet, getting regular exercise, not using drugs or products that contain nicotine and tobacco, and limiting alcohol use. What can I expect for my preventive care visit? Physical exam Your health care provider will check:  Height and weight. These may be used to calculate body mass index (BMI), which is a measurement that tells if you are at a healthy weight.  Heart rate and blood pressure.  Your skin for abnormal spots. Counseling Your health care provider may ask you questions about:  Alcohol, tobacco, and drug use.  Emotional well-being.  Home and relationship well-being.  Sexual activity.  Eating habits.  Work and work Statistician. What immunizations do I need?  Influenza (flu) vaccine  This is recommended every year. Tetanus, diphtheria, and pertussis (Tdap) vaccine  You may need a Td booster every 10 years. Varicella (chickenpox) vaccine  You may need this vaccine if you have not already been vaccinated. Zoster (shingles) vaccine  You may need this after age 75. Measles, mumps, and rubella (MMR) vaccine  You may need at least one dose of MMR if you were born in 1957 or later. You may also need a second dose. Pneumococcal conjugate (PCV13) vaccine  You may need this if you have certain conditions and were not previously vaccinated. Pneumococcal polysaccharide (PPSV23)  vaccine  You may need one or two doses if you smoke cigarettes or if you have certain conditions. Meningococcal conjugate (MenACWY) vaccine  You may need this if you have certain conditions. Hepatitis A vaccine  You may need this if you have certain conditions or if you travel or work in places where you may be exposed to hepatitis A. Hepatitis B vaccine  You may need this if you have certain conditions or if you travel or work in places where you may be exposed to hepatitis B. Haemophilus influenzae type b (Hib) vaccine  You may need this if you have certain risk factors. Human papillomavirus (HPV) vaccine  If recommended by your health care provider, you may need three doses over 6 months. You may receive vaccines as individual doses or as more than one vaccine together in one shot (combination vaccines). Talk with your health care provider about the risks and benefits of combination vaccines. What tests do I need? Blood tests  Lipid and cholesterol levels. These may be checked every 5 years, or more frequently if you are over 42 years old.  Hepatitis C test.  Hepatitis B test. Screening  Lung cancer screening. You may have this screening every year starting at age 45 if you have a 30-pack-year history of smoking and currently smoke or have quit within the past 15 years.  Prostate cancer screening. Recommendations will vary depending on your family history and other risks.  Colorectal cancer screening. All adults should have this screening starting at age 39 and continuing until age 78. Your health care provider may recommend screening at age 52 if you  are at increased risk. You will have tests every 1-10 years, depending on your results and the type of screening test.  Diabetes screening. This is done by checking your blood sugar (glucose) after you have not eaten for a while (fasting). You may have this done every 1-3 years.  Sexually transmitted disease (STD)  testing. Follow these instructions at home: Eating and drinking  Eat a diet that includes fresh fruits and vegetables, whole grains, lean protein, and low-fat dairy products.  Take vitamin and mineral supplements as recommended by your health care provider.  Do not drink alcohol if your health care provider tells you not to drink.  If you drink alcohol: ? Limit how much you have to 0-2 drinks a day. ? Be aware of how much alcohol is in your drink. In the [place] one drink equals one 12 oz bottle of beer (355 mL), one 5 oz glass of wine (148 mL), or one 1 oz glass of hard liquor (44 mL). Lifestyle  Take daily care of your teeth and gums.  Stay active. Exercise for at least 30 minutes on 5 or more days each week.  Do not use any products that contain nicotine or tobacco, such as cigarettes, e-cigarettes, and chewing tobacco. If you need help quitting, ask your health care provider.  If you are sexually active, practice safe sex. Use a condom or other form of protection to prevent STIs (sexually transmitted infections).  Talk with your health care provider about taking a low-dose aspirin every day starting at age 58. What's next?  Go to your health care provider once a year for a well check visit.  Ask your health care provider how often you should have your eyes and teeth checked.  Stay up to date on all vaccines. This information is not intended to replace advice given to you by your health care provider. Make sure you discuss any questions you have with your health care provider. Document Revised: 08/15/2018 Document Reviewed: 08/15/2018 Elsevier Patient Education  2020 [place] Directive  Advance directives are legal documents that let you make choices ahead of time about your health care and medical treatment in case you become unable to communicate for yourself. Advance directives are a way for you to make known your wishes to family, friends, and health care  providers. This can let others know about your end-of-life care if you become unable to communicate. Discussing and writing advance directives should happen over time rather than all at once. Advance directives can be changed depending on your situation and what you want, even after you have signed the advance directives. There are different types of advance directives, such as:  Medical power of attorney.  Living will.  Do not resuscitate (DNR) or do not attempt resuscitation (DNAR) order. Health care proxy and medical power of attorney A health care proxy is also called a health care agent. This is a person who is appointed to make medical decisions for you in cases where you are unable to make the decisions yourself. Generally, people choose someone they know well and trust to represent their preferences. Make sure to ask this person for an agreement to act as your proxy. A proxy may have to exercise judgment in the event of a medical decision for which your wishes are not known. A medical power of attorney is a legal document that names your health care proxy. Depending on the laws in your state, after the document is written, it may  also need to be:  Signed.  Notarized.  Dated.  Copied.  Witnessed.  Incorporated into your medical record. You may also want to appoint someone to manage your money in a situation in which you are unable to do so. This is called a durable power of attorney for finances. It is a separate legal document from the durable power of attorney for health care. You may choose the same person or someone different from your health care proxy to act as your agent in money matters. If you do not appoint a proxy, or if there is a concern that the proxy is not acting in your best interests, a court may appoint a guardian to act on your behalf. Living will A living will is a set of instructions that state your wishes about medical care when you cannot express them  yourself. Health care providers should keep a copy of your living will in your medical record. You may want to give a copy to family members or friends. To alert caregivers in case of an emergency, you can place a card in your wallet to let them know that you have a living will and where they can find it. A living will is used if you become:  Terminally ill.  Disabled.  Unable to communicate or make decisions. Items to consider in your living will include:  To use or not to use life-support equipment, such as dialysis machines and breathing machines (ventilators).  A DNR or DNAR order. This tells health care providers not to use cardiopulmonary resuscitation (CPR) if breathing or heartbeat stops.  To use or not to use tube feeding.  To be given or not to be given food and fluids.  Comfort (palliative) care when the goal becomes comfort rather than a cure.  Donation of organs and tissues. A living will does not give instructions for distributing your money and property if you should pass away. DNR or DNAR A DNR or DNAR order is a request not to have CPR in the event that your heart stops beating or you stop breathing. If a DNR or DNAR order has not been made and shared, a health care provider will try to help any patient whose heart has stopped or who has stopped breathing. If you plan to have surgery, talk with your health care provider about how your DNR or DNAR order will be followed if problems occur. What if I do not have an advance directive? If you do not have an advance directive, some states assign family decision makers to act on your behalf based on how closely you are related to them. Each state has its own laws about advance directives. You may want to check with your health care provider, attorney, or state representative about the laws in your state. Summary  Advance directives are the legal documents that allow you to make choices ahead of time about your health care and  medical treatment in case you become unable to tell others about your care.  The process of discussing and writing advance directives should happen over time. You can change the advance directives, even after you have signed them.  Advance directives include DNR or DNAR orders, living wills, and designating an agent as your medical power of attorney. This information is not intended to replace advice given to you by your health care provider. Make sure you discuss any questions you have with your health care provider. Document Revised: 03/20/2019 Document Reviewed: 03/20/2019 Elsevier Patient Education  [place].

## 2019-11-18 NOTE — Assessment & Plan Note (Signed)
Likely due to stress.  Severity of chest pain is concerning however briefness and the situation is not consistent with angina.  EKG is normal.  Check troponin just in case.

## 2019-11-18 NOTE — Assessment & Plan Note (Signed)
Refer to ENT

## 2019-11-18 NOTE — Assessment & Plan Note (Signed)
Check PSA level 

## 2019-11-19 ENCOUNTER — Telehealth: Payer: Self-pay

## 2019-11-19 ENCOUNTER — Other Ambulatory Visit: Payer: Self-pay

## 2019-11-19 DIAGNOSIS — R972 Elevated prostate specific antigen [PSA]: Secondary | ICD-10-CM

## 2019-11-19 LAB — COMPREHENSIVE METABOLIC PANEL
ALT: 28 IU/L (ref 0–44)
AST: 19 IU/L (ref 0–40)
Albumin/Globulin Ratio: 1.9 (ref 1.2–2.2)
Albumin: 4.5 g/dL (ref 3.8–4.9)
Alkaline Phosphatase: 65 IU/L (ref 39–117)
BUN/Creatinine Ratio: 13 (ref 9–20)
BUN: 14 mg/dL (ref 6–24)
Bilirubin Total: 0.4 mg/dL (ref 0.0–1.2)
CO2: 28 mmol/L (ref 20–29)
Calcium: 9.6 mg/dL (ref 8.7–10.2)
Chloride: 100 mmol/L (ref 96–106)
Creatinine, Ser: 1.08 mg/dL (ref 0.76–1.27)
GFR calc Af Amer: 88 mL/min/{1.73_m2} (ref >59)
GFR calc non Af Amer: 76 mL/min/{1.73_m2} (ref >59)
Globulin, Total: 2.4 g/dL (ref 1.5–4.5)
Glucose: 78 mg/dL (ref 65–99)
Potassium: 4.4 mmol/L (ref 3.5–5.2)
Sodium: 140 mmol/L (ref 134–144)
Total Protein: 6.9 g/dL (ref 6.0–8.5)

## 2019-11-19 LAB — CBC WITH DIFFERENTIAL/PLATELET
Basophils Absolute: 0 10*3/uL (ref 0.0–0.2)
Basos: 1 %
EOS (ABSOLUTE): 0.1 10*3/uL (ref 0.0–0.4)
Eos: 1 %
Hematocrit: 44.6 % (ref 37.5–51.0)
Hemoglobin: 15.2 g/dL (ref 13.0–17.7)
Immature Grans (Abs): 0 10*3/uL (ref 0.0–0.1)
Immature Granulocytes: 0 %
Lymphocytes Absolute: 2.7 10*3/uL (ref 0.7–3.1)
Lymphs: 36 %
MCH: 33.3 pg — ABNORMAL HIGH (ref 26.6–33.0)
MCHC: 34.1 g/dL (ref 31.5–35.7)
MCV: 98 fL — ABNORMAL HIGH (ref 79–97)
Monocytes Absolute: 0.7 10*3/uL (ref 0.1–0.9)
Monocytes: 9 %
Neutrophils Absolute: 4 10*3/uL (ref 1.4–7.0)
Neutrophils: 53 %
Platelets: 272 10*3/uL (ref 150–450)
RBC: 4.56 x10E6/uL (ref 4.14–5.80)
RDW: 11.9 % (ref 11.6–15.4)
WBC: 7.6 10*3/uL (ref 3.4–10.8)

## 2019-11-19 LAB — TROPONIN I: Troponin I: <0.01 ng/mL (ref 0.00–0.04)

## 2019-11-19 LAB — PSA: Prostate Specific Ag, Serum: 7.7 ng/mL — ABNORMAL HIGH (ref 0.0–4.0)

## 2019-11-19 LAB — TSH: TSH: 0.772 u[IU]/mL (ref 0.450–4.500)

## 2019-11-19 NOTE — Telephone Encounter (Signed)
Called patient per Dr. Tobie Poet to inform him that his troponin level was normal, however his PSA was elevated. She recommends he be seen by Urology. Patient is aware and referral has been ordered.

## 2019-11-22 ENCOUNTER — Encounter: Payer: Self-pay | Admitting: Family Medicine

## 2019-11-22 NOTE — Addendum Note (Signed)
Addended byRochel Brome on: 11/22/2019 10:05 AM   Modules accepted: Orders

## 2019-12-08 ENCOUNTER — Other Ambulatory Visit: Payer: Self-pay | Admitting: Family Medicine

## 2019-12-16 NOTE — Addendum Note (Signed)
Addended by: Oleta Mouse on: 12/16/2019 09:37 AM   Modules accepted: Orders

## 2020-01-15 ENCOUNTER — Other Ambulatory Visit: Payer: Self-pay | Admitting: Family Medicine

## 2020-04-15 ENCOUNTER — Other Ambulatory Visit: Payer: Self-pay | Admitting: Family Medicine

## 2020-10-04 ENCOUNTER — Telehealth (INDEPENDENT_AMBULATORY_CARE_PROVIDER_SITE_OTHER): Admitting: Family Medicine

## 2020-10-04 ENCOUNTER — Encounter: Payer: Self-pay | Admitting: Family Medicine

## 2020-10-04 VITALS — Temp 97.4°F

## 2020-10-04 DIAGNOSIS — U071 COVID-19: Secondary | ICD-10-CM | POA: Diagnosis not present

## 2020-10-04 DIAGNOSIS — J208 Acute bronchitis due to other specified organisms: Secondary | ICD-10-CM | POA: Diagnosis not present

## 2020-10-04 NOTE — Progress Notes (Incomplete)
Virtual Visit via Telephone Note   This visit type was conducted due to national recommendations for restrictions regarding the COVID-19 Pandemic (e.g. social distancing) in an effort to limit this patient's exposure and mitigate transmission in our community.  Due to his co-morbid illnesses, this patient is at least at moderate risk for complications without adequate follow up.  This format is felt to be most appropriate for this patient at this time.  The patient did not have access to video technology/had technical difficulties with video requiring transitioning to audio format only (telephone).  All issues noted in this document were discussed and addressed.  No physical exam could be performed with this format.  Patient verbally consented to a telehealth visit.   Date:  10/04/2020   ID:  Edward Hoover, DOB 07/23/1961, MRN [id number]  Patient Location: Home Provider Location: Office/Clinic  PCP:  Rochel Brome, MD   Evaluation Performed:  Cough. Chief Complaint:  Nasal congestion.  History of Present Illness:    Edward Hoover is a 60 y.o. male with Covid positive test (yesterday) symptoms started yesterday morning. Grandson (44 month old) tested positive on 09/19/2020 (doing well.) c/o of cough, congestion and some SOB.  No fever, but has had chills and sweats, loss of appetite, diarrhea, body aches, fatigue. Given dayquil and nyquil, mucinex dm. Has Zinc and Vitamin C, D, Selenium.  Took J&J vaccine.  The patient is positive COVID-19 infection according to a at home rapid test.    Past Medical History:  Diagnosis Date  . Adjustment insomnia   . Asthma   . BCC (basal cell carcinoma), face   . Depression   . Diverticulitis   . Enlarged prostate   . GERD (gastroesophageal reflux disease)     Past Surgical History:  Procedure Laterality Date  . ABDOMINAL SURGERY    . CATARACT EXTRACTION, BILATERAL    . LAPAROTOMY N/A 01/22/2018   Procedure: EXPLORATORY LAPAROTOMY LYSIS  OF ADHESIONS;  Surgeon: Leighton Ruff, MD;  Location: [facility];  Service: General;  Laterality: N/A;  . SHOULDER SURGERY      Family History  Problem Relation Age of Onset  . Diabetes Other   . Hypertension Other   . CAD Other     Social History   Socioeconomic History  . Marital status: Married    Spouse name: Not on file  . Number of children: Not on file  . Years of education: Not on file  . Highest education level: Not on file  Occupational History  . Not on file  Tobacco Use  . Smoking status: Never Smoker  . Smokeless tobacco: Current User  Substance and Sexual Activity  . Alcohol use: Yes    Alcohol/week: 3.0 standard drinks    Types: 3 Cans of beer per week    Comment: daily   . Drug use: No  . Sexual activity: Not on file  Other Topics Concern  . Not on file  Social History Narrative  . Not on file   Social Determinants of Health   Financial Resource Strain: Not on file  Food Insecurity: Not on file  Transportation Needs: Not on file  Physical Activity: Not on file  Stress: Not on file  Social Connections: Not on file  Intimate Partner Violence: Not on file    Outpatient Medications Prior to Visit  Medication Sig Dispense Refill  . lansoprazole (PREVACID) 30 MG capsule TAKE 1 CAPSULE BY MOUTH TWICE DAILY 180 capsule 1  . tamsulosin (FLOMAX)  0.4 MG CAPS capsule TAKE 1 CAPSULE BEFORE BED 90 capsule 3  . traZODone (DESYREL) 100 MG tablet TAKE 1 TABLET AT BEDTIME 90 tablet 3   No facility-administered medications prior to visit.    Allergies:   Nsaids   Social History   Tobacco Use  . Smoking status: Never Smoker  . Smokeless tobacco: Current User  Substance Use Topics  . Alcohol use: Yes    Alcohol/week: 3.0 standard drinks    Types: 3 Cans of beer per week    Comment: daily   . Drug use: No     Review of Systems  Constitutional: Positive for chills and malaise/fatigue. Negative for fever.  HENT: Positive for congestion. Negative for sore  throat.   Respiratory: Positive for cough. Negative for shortness of breath.   Cardiovascular: Negative for chest pain.  Gastrointestinal: Positive for diarrhea. Negative for nausea and vomiting.  Musculoskeletal:       Body aches  Neurological: Positive for headaches.     Labs/Other Tests and Data Reviewed:    Recent Labs: 11/18/2019: ALT 28; BUN 14; Creatinine, Ser 1.08; Hemoglobin 15.2; Platelets 272; Potassium 4.4; Sodium 140; TSH 0.772   Recent Lipid Panel Lab Results  Component Value Date/Time   CHOL 187 10/19/2006 08:01 AM   TRIG 113 01/28/2018 05:47 AM   HDL 38.8 (L) 10/19/2006 08:01 AM   CHOLHDL 4.8 CALC 10/19/2006 08:01 AM   LDLCALC 131 (H) 10/19/2006 08:01 AM    Wt Readings from Last 3 Encounters:  11/18/19 188 lb 6.4 oz (85.5 kg)  10/21/16 183 lb (83 kg)  05/10/16 177 lb (80.3 kg)     Objective:    Vital Signs:  Temp (!) 97.4 F (36.3 C)    Physical Exam   ASSESSMENT & PLAN:   1. Acute bronchitis due to COVID-19 virus  Mucinex, nyquil, dayquil, tylenol.  Fluids and rest. Gatorade. 3 meals per day.   No orders of the defined types were placed in this encounter.    No orders of the defined types were placed in this encounter.  Time spent on 12 minutes.  Follow Up:  {F/U [phone]} {follow up:15908}  Signed,  Rochel Brome, MD  10/04/2020 3:03 PM    [place]

## 2020-10-04 NOTE — Progress Notes (Signed)
Virtual Visit via Telephone Note   This visit type was conducted due to national recommendations for restrictions regarding the COVID-19 Pandemic (e.g. social distancing) in an effort to limit this patient's exposure and mitigate transmission in our community.  Due to his co-morbid illnesses, this patient is at least at moderate risk for complications without adequate follow up.  This format is felt to be most appropriate for this patient at this time.  The patient did not have access to video technology/had technical difficulties with video requiring transitioning to audio format only (telephone).  All issues noted in this document were discussed and addressed.  No physical exam could be performed with this format.  Patient verbally consented to a telehealth visit.   Date:  10/04/2020   ID:  Edward Hoover, DOB 1961/05/13, MRN [id number]  Patient Location: Home Provider Location: Office/Clinic  PCP:  Rochel Brome, MD   Evaluation Performed: acute  Chief Complaint:  cough  History of Present Illness:    Edward Hoover is a 60 y.o. male with Covid positive test (yesterday) symptoms started yesterday morning. Grandson (68 month old) tested positive on 09/19/2020 (doing well.) c/o of cough, congestion and some SOB.  No fever, but has had chills and sweats, loss of appetite, diarrhea, body aches, fatigue. Given dayquil and nyquil, mucinex dm. Has Zinc and Vitamin C, D, Selenium.  Took J&J vaccine.  The patient is positive COVID-19 infection according to a at home rapid test.    Past Medical History:  Diagnosis Date  . Adjustment insomnia   . Asthma   . BCC (basal cell carcinoma), face   . Depression   . Diverticulitis   . Enlarged prostate   . GERD (gastroesophageal reflux disease)     Past Surgical History:  Procedure Laterality Date  . ABDOMINAL SURGERY    . CATARACT EXTRACTION, BILATERAL    . LAPAROTOMY N/A 01/22/2018   Procedure: EXPLORATORY LAPAROTOMY LYSIS OF  ADHESIONS;  Surgeon: Leighton Ruff, MD;  Location: [facility];  Service: General;  Laterality: N/A;  . SHOULDER SURGERY      Family History  Problem Relation Age of Onset  . Diabetes Other   . Hypertension Other   . CAD Other     Social History   Socioeconomic History  . Marital status: Married    Spouse name: Not on file  . Number of children: Not on file  . Years of education: Not on file  . Highest education level: Not on file  Occupational History  . Not on file  Tobacco Use  . Smoking status: Never Smoker  . Smokeless tobacco: Current User  Substance and Sexual Activity  . Alcohol use: Yes    Alcohol/week: 3.0 standard drinks    Types: 3 Cans of beer per week    Comment: daily   . Drug use: No  . Sexual activity: Not on file  Other Topics Concern  . Not on file  Social History Narrative  . Not on file   Social Determinants of Health   Financial Resource Strain: Not on file  Food Insecurity: Not on file  Transportation Needs: Not on file  Physical Activity: Not on file  Stress: Not on file  Social Connections: Not on file  Intimate Partner Violence: Not on file    Outpatient Medications Prior to Visit  Medication Sig Dispense Refill  . lansoprazole (PREVACID) 30 MG capsule TAKE 1 CAPSULE BY MOUTH TWICE DAILY 180 capsule 1  . tamsulosin (FLOMAX) 0.4  MG CAPS capsule TAKE 1 CAPSULE BEFORE BED 90 capsule 3  . traZODone (DESYREL) 100 MG tablet TAKE 1 TABLET AT BEDTIME 90 tablet 3   No facility-administered medications prior to visit.    Allergies:   Nsaids   Social History   Tobacco Use  . Smoking status: Never Smoker  . Smokeless tobacco: Current User  Substance Use Topics  . Alcohol use: Yes    Alcohol/week: 3.0 standard drinks    Types: 3 Cans of beer per week    Comment: daily   . Drug use: No     Review of Systems  Constitutional: Positive for chills and malaise/fatigue. Negative for fever.  HENT: Positive for congestion. Negative for sore  throat.   Respiratory: Positive for cough. Negative for shortness of breath.   Cardiovascular: Negative for chest pain.  Gastrointestinal: Positive for diarrhea. Negative for nausea and vomiting.  Musculoskeletal:       Body aches  Neurological: Positive for headaches.     Labs/Other Tests and Data Reviewed:    Recent Labs: 11/18/2019: ALT 28; BUN 14; Creatinine, Ser 1.08; Hemoglobin 15.2; Platelets 272; Potassium 4.4; Sodium 140; TSH 0.772   Recent Lipid Panel Lab Results  Component Value Date/Time   CHOL 187 10/19/2006 08:01 AM   TRIG 113 01/28/2018 05:47 AM   HDL 38.8 (L) 10/19/2006 08:01 AM   CHOLHDL 4.8 CALC 10/19/2006 08:01 AM   LDLCALC 131 (H) 10/19/2006 08:01 AM    Wt Readings from Last 3 Encounters:  11/18/19 188 lb 6.4 oz (85.5 kg)  10/21/16 183 lb (83 kg)  05/10/16 177 lb (80.3 kg)     Objective:    Vital Signs:  Temp (!) 97.4 F (36.3 C)    Physical Exam   ASSESSMENT & PLAN:    1. Acute bronchitis due to COVID-19 virus  Mucinex, nyquil, dayquil, tylenol.  Fluids and rest. Gatorade. 3 meals per day.   COVID-19 Education: given. I spent 10 minutes dedicated to the care of this patient on the date of this encounter Follow Up:  Virtual Visit  prn  Signed,  Arsenio Katz, Gay  10/04/2020 11:47 AM    [place]

## 2020-10-11 ENCOUNTER — Other Ambulatory Visit: Payer: Self-pay | Admitting: Family Medicine

## 2021-03-08 ENCOUNTER — Other Ambulatory Visit: Payer: Self-pay

## 2021-03-08 ENCOUNTER — Ambulatory Visit (INDEPENDENT_AMBULATORY_CARE_PROVIDER_SITE_OTHER): Admitting: Family Medicine

## 2021-03-08 ENCOUNTER — Encounter: Payer: Self-pay | Admitting: Family Medicine

## 2021-03-08 VITALS — BP 110/70 | HR 60 | Temp 98.1°F | Resp 16 | Ht 70.0 in | Wt 184.8 lb

## 2021-03-08 DIAGNOSIS — R55 Syncope and collapse: Secondary | ICD-10-CM | POA: Diagnosis not present

## 2021-03-08 NOTE — Progress Notes (Signed)
Acute Office Visit  Subjective:    Patient ID: Edward Hoover., male    DOB: 1961/06/17, 60 y.o.   MRN: [id number]  Chief Complaint  Patient presents with   syncopal episode    HPI Patient is in today for syncope passed on 2021-02-01. Patient is a 60 year old white male who presented to the emergency department via EMS after having a syncopal episode.  Emergency department reported that he had had heavy alcohol use by his recollection he had had 2-3 beers while on the beach.  He lost his balance and hit his head resulting in a laceration which was 4 cm x 3 cm x 1 cm and length it was an angulated laceration on his posterior scalp..  Patient did report loss of consciousness.  CT scan of the C-spine and brain were normal.  EKG at that time showed normal sinus rhythm.  Heart rate 75.  No acute ST changes.  The patient's laceration was repaired.  Patient has felt great ever since returning well.  He feels he may have gotten dehydrated in addition to drinking a moderate amount of alcohol.  Past Medical History:  Diagnosis Date   Adjustment insomnia    Asthma    BCC (basal cell carcinoma), face    Depression    Diverticulitis    Enlarged prostate    GERD (gastroesophageal reflux disease)     Past Surgical History:  Procedure Laterality Date   ABDOMINAL SURGERY     CATARACT EXTRACTION, BILATERAL     LAPAROTOMY N/A 01/22/2018   Procedure: EXPLORATORY LAPAROTOMY LYSIS OF ADHESIONS;  Surgeon: Leighton Ruff, MD;  Location: [facility];  Service: General;  Laterality: N/A;   SHOULDER SURGERY      Family History  Problem Relation Age of Onset   Diabetes Other    Hypertension Other    CAD Other     Social History   Socioeconomic History   Marital status: Married    Spouse name: Not on file   Number of children: Not on file   Years of education: Not on file   Highest education level: Not on file  Occupational History   Not on file  Tobacco Use   Smoking status: Never    Smokeless tobacco: Current  Substance and Sexual Activity   Alcohol use: Yes    Alcohol/week: 3.0 standard drinks    Types: 3 Cans of beer per week    Comment: daily    Drug use: No   Sexual activity: Not on file  Other Topics Concern   Not on file  Social History Narrative   Not on file   Social Determinants of Health   Financial Resource Strain: Not on file  Food Insecurity: Not on file  Transportation Needs: Not on file  Physical Activity: Not on file  Stress: Not on file  Social Connections: Not on file  Intimate Partner Violence: Not on file    Outpatient Medications Prior to Visit  Medication Sig Dispense Refill   lansoprazole (PREVACID) 30 MG capsule TAKE 1 CAPSULE BY MOUTH TWICE DAILY 180 capsule 1   tamsulosin (FLOMAX) 0.4 MG CAPS capsule TAKE 1 CAPSULE BEFORE BED 90 capsule 3   traZODone (DESYREL) 100 MG tablet TAKE 1 TABLET AT BEDTIME 90 tablet 3   No facility-administered medications prior to visit.    Allergies  Allergen Reactions   Nsaids Anaphylaxis    Review of Systems  Constitutional:  Negative for chills and fever.  HENT:  Negative  for congestion, rhinorrhea and sore throat.   Respiratory:  Negative for cough and shortness of breath.   Cardiovascular:  Negative for chest pain and palpitations.  Gastrointestinal:  Negative for abdominal pain, constipation, diarrhea, nausea and vomiting.  Genitourinary:  Negative for dysuria and urgency.  Musculoskeletal:  Negative for arthralgias, back pain and myalgias.  Neurological:  Negative for dizziness and headaches.  Psychiatric/Behavioral:  Negative for dysphoric mood. The patient is not nervous/anxious.       Objective:    Physical Exam Vitals reviewed.  Constitutional:      Appearance: Normal appearance. He is normal weight.  Cardiovascular:     Rate and Rhythm: Normal rate and regular rhythm.     Heart sounds: No murmur heard. Pulmonary:     Effort: Pulmonary effort is normal.     Breath  sounds: Normal breath sounds.  Abdominal:     General: Abdomen is flat. Bowel sounds are normal.     Palpations: Abdomen is soft.     Tenderness: There is no abdominal tenderness. Murphy's sign: Mild.  Neurological:     Mental Status: He is alert and oriented to person, place, and time.  Psychiatric:        Mood and Affect: Mood normal.        Behavior: Behavior normal.  Error: no abdominal tenderness. I am unable to edit smart block above.   BP 110/70   Pulse 60   Temp 98.1 F (36.7 C)   Resp 16   Ht 5\' 10"  (1.778 m)   Wt 184 lb 12.8 oz (83.8 kg)   BMI 26.52 kg/m  Wt Readings from Last 3 Encounters:  03/08/21 184 lb 12.8 oz (83.8 kg)  11/18/19 188 lb 6.4 oz (85.5 kg)  10/21/16 183 lb (83 kg)    Health Maintenance Due  Topic Date Due   COVID-19 Vaccine (1) Never done   HIV Screening  Never done   Hepatitis C Screening  Never done   Zoster Vaccines- Shingrix (1 of 2) Never done   COLONOSCOPY (Pts 45-53yrs Insurance coverage will need to be confirmed)  03/20/2018    There are no preventive care reminders to display for this patient.   Lab Results  Component Value Date   TSH 0.772 11/18/2019   Lab Results  Component Value Date   WBC 7.6 11/18/2019   HGB 15.2 11/18/2019   HCT 44.6 11/18/2019   MCV 98 (H) 11/18/2019   PLT 272 11/18/2019   Lab Results  Component Value Date   NA 140 11/18/2019   K 4.4 11/18/2019   CO2 28 11/18/2019   GLUCOSE 78 11/18/2019   BUN 14 11/18/2019   CREATININE 1.08 11/18/2019   BILITOT 0.4 11/18/2019   ALKPHOS 65 11/18/2019   AST 19 11/18/2019   ALT 28 11/18/2019   PROT 6.9 11/18/2019   ALBUMIN 4.5 11/18/2019   CALCIUM 9.6 11/18/2019   ANIONGAP 8 01/30/2018   Lab Results  Component Value Date   CHOL 187 10/19/2006   Lab Results  Component Value Date   HDL 38.8 (L) 10/19/2006   Lab Results  Component Value Date   LDLCALC 131 (H) 10/19/2006   Lab Results  Component Value Date   TRIG 113 01/28/2018   Lab Results   Component Value Date   CHOLHDL 4.8 CALC 10/19/2006   No results found for: HGBA1C     Assessment & Plan:  1. Syncope, unspecified syncope type - EKG 12-Lead : sinus bradycardia.  Reassurance given.  If any further episodes occur, pt is to call and we will proceed with a cardiology referral.  Likely related to dehydration. Has been great since and it has been nearly 5 weeks.   Orders Placed This Encounter  Procedures   EKG 12-Lead    Follow-up: No follow-ups on file.  An After Visit Summary was printed and given to the patient.  Rochel Brome, MD [facility] [phone]

## 2021-03-24 ENCOUNTER — Encounter: Payer: Self-pay | Admitting: Family Medicine

## 2021-08-01 ENCOUNTER — Other Ambulatory Visit: Payer: Self-pay

## 2021-08-01 DIAGNOSIS — R55 Syncope and collapse: Secondary | ICD-10-CM

## 2021-08-19 ENCOUNTER — Encounter: Payer: Self-pay | Admitting: Cardiovascular Disease

## 2021-08-19 ENCOUNTER — Ambulatory Visit (INDEPENDENT_AMBULATORY_CARE_PROVIDER_SITE_OTHER): Admitting: Cardiovascular Disease

## 2021-08-19 ENCOUNTER — Other Ambulatory Visit: Payer: Self-pay

## 2021-08-19 ENCOUNTER — Ambulatory Visit (INDEPENDENT_AMBULATORY_CARE_PROVIDER_SITE_OTHER)

## 2021-08-19 DIAGNOSIS — R55 Syncope and collapse: Secondary | ICD-10-CM | POA: Diagnosis not present

## 2021-08-19 NOTE — Progress Notes (Unsigned)
Enrolled patient for a 14 day Zio XT  monitor to be mailed to patients home  °

## 2021-08-19 NOTE — Patient Instructions (Signed)
Medication Instructions:  Your physician recommends that you continue on your current medications as directed. Please refer to the Current Medication list given to you today.  *If you need a refill on your cardiac medications before your next appointment, please call your pharmacy*   Testing/Procedures: Your physician has requested that you have an echocardiogram. Echocardiography is a painless test that uses sound waves to create images of your heart. It provides your doctor with information about the size and shape of your heart and how well your hearts chambers and valves are working. This procedure takes approximately one hour. There are no restrictions for this procedure. This procedure is done at [address] Monitor Instructions  Your physician has requested you wear a ZIO patch monitor for 14 days.  This is a single patch monitor. [employer] supplies one patch monitor per enrollment. Additional stickers are not available. Please do not apply patch if you will be having a Nuclear Stress Test,  Echocardiogram, Cardiac CT, MRI, or Chest Xray during the period you would be wearing the  monitor. The patch cannot be worn during these tests. You cannot remove and re-apply the  ZIO XT patch monitor.  Your ZIO patch monitor will be mailed 3 day USPS to your address on file. It may take 3-5 days  to receive your monitor after you have been enrolled.  Once you have received your monitor, please review the enclosed instructions. Your monitor  has already been registered assigning a specific monitor serial # to you.  Billing and Patient Assistance Program Information  We have supplied [employer] with any of your insurance information on file for billing purposes. [employer] offers a sliding scale Patient Assistance Program for patients that do not have  insurance, or whose insurance does not completely cover the cost of the ZIO monitor.  You must apply for the Patient  Assistance Program to qualify for this discounted rate.  To apply, please call [employer] at [phone], select option 4, select option 2, ask to apply for  Patient Assistance Program. Theodore Demark will ask your household income, and how many people  are in your household. They will quote your out-of-pocket cost based on that information.  [employer] will also be able to set up a 89-month, interest-free payment plan if needed.  Applying the monitor   Shave hair from upper left chest.  Hold abrader disc by orange tab. Rub abrader in 40 strokes over the upper left chest as  indicated in your monitor instructions.  Clean area with 4 enclosed alcohol pads. Let dry.  Apply patch as indicated in monitor instructions. Patch will be placed under collarbone on left  side of chest with arrow pointing upward.  Rub patch adhesive wings for 2 minutes. Remove white label marked "1". Remove the white  label marked "2". Rub patch adhesive wings for 2 additional minutes.  While looking in a mirror, press and release button in center of patch. A small green light will  flash 3-4 times. This will be your only indicator that the monitor has been turned on.  Do not shower for the first 24 hours. You may shower after the first 24 hours.  Press the button if you feel a symptom. You will hear a small click. Record Date, Time and  Symptom in the Patient Logbook.  When you are ready to remove the patch, follow instructions on the last 2 pages of Patient  Logbook. Stick patch monitor onto the last page  of Patient Logbook.  Place Patient Logbook in the blue and white box. Use locking tab on box and tape box closed  securely. The blue and white box has prepaid postage on it. Please place it in the mailbox as  soon as possible. Your physician should have your test results approximately 7 days after the  monitor has been mailed back to [facility].  Call [place] at [phone] if you have questions  regarding  your ZIO XT patch monitor. Call them immediately if you see an orange light blinking on your  monitor.  If your monitor falls off in less than 4 days, contact our Monitor department at [phone].  If your monitor becomes loose or falls off after 4 days call [employer] at [phone] for  suggestions on securing your monitor    Follow-Up: At [facility], you and your health needs are our priority.  As part of our continuing mission to provide you with exceptional heart care, we have created designated Provider Care Teams.  These Care Teams include your primary Cardiologist (physician) and Advanced Practice Providers (APPs -  Physician Assistants and Nurse Practitioners) who all work together to provide you with the care you need, when you need it.  We recommend signing up for the patient portal called "MyChart".  Sign up information is provided on this After Visit Summary.  MyChart is used to connect with patients for Virtual Visits (Telemedicine).  Patients are able to view lab/test results, encounter notes, upcoming appointments, etc.  Non-urgent messages can be sent to your provider as well.   To learn more about what you can do with MyChart, go to [email or web address].    Your next appointment:   6 month(s)  The format for your next appointment:   In Person  Provider:   Quay Burow, MD

## 2021-08-19 NOTE — Assessment & Plan Note (Signed)
Edward Hoover was referred to me by Dr. Tobie Poet for evaluation of syncope.  His first episode occurred on 01/31/2021 while in [place].  He had loss of consciousness late at night.  He had spent the day on the beach but apparently was well-hydrated.  He had several beers that night.  He fell down and hit his head and was seen by EMS and taken to the ER for a brief evaluation but not admitted.  He had a second episode of syncope 07/29/2021 when he got up to get something from the refrigerator at approxi-12:30 in the morning and suddenly passed out without warning.  He had no other symptoms.  I am going to get a 2D echo and a 2-week Zio patch to further evaluate.  I did tell him that since we do not have a diagnosis below requires that he should not drive for 6 months.

## 2021-08-19 NOTE — Progress Notes (Signed)
08/19/2021 [place]   12/12/1960  [id number]  Primary Physician Cox, Elnita Maxwell, MD Primary Cardiologist: Lorretta Harp MD Garret Reddish, [place], [place]  HPI:  Edward Hoover. is a 60 y.o. thin and fit appearing married Caucasian male father of 2, grandfather 1 grandchild who was referred by Dr. Tobie Poet, his PCP, for syncope.  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  His risk factors include remote tobacco abuse, he currently vapes.  He does drink 12-15 beers a day, several during the week and 6/day on the weekend approximately.  There is no family history for heart disease.  He is never had a heart attack or stroke.  He denies chest pain or shortness of breath.  He had his first episode of syncope while in [place] 01/31/2021 where he collapsed on the floor while his family was at home around midnight.  He did hit his head.  He was evaluated by EMS and was brought to the emergency room for a brief evaluation.  His second episode was 07/29/2021 when he was home alone and around 12:30 in the morning got up to go get something out of the refrigerator and collapsed on the floor.  He was dizzy for several minutes prior to being able to get up.   Current Meds  Medication Sig   cyanocobalamin 1000 MCG tablet Take 1,000 mcg by mouth daily.   lansoprazole (PREVACID) 30 MG capsule TAKE 1 CAPSULE BY MOUTH TWICE DAILY   tamsulosin (FLOMAX) 0.4 MG CAPS capsule TAKE 1 CAPSULE BEFORE BED   temazepam (RESTORIL) 15 MG capsule Take 15 mg by mouth at bedtime as needed for sleep.   traZODone (DESYREL) 100 MG tablet TAKE 1 TABLET AT BEDTIME     Allergies  Allergen Reactions   Nsaids Anaphylaxis    Social History   Socioeconomic History   Marital status: Married    Spouse name: Not on file   Number of children: Not on file   Years of education: Not on file   Highest education level: Not on file  Occupational History   Not on file  Tobacco  Use   Smoking status: Never   Smokeless tobacco: Current  Substance and Sexual Activity   Alcohol use: Yes    Alcohol/week: 3.0 standard drinks    Types: 3 Cans of beer per week    Comment: daily    Drug use: No   Sexual activity: Not on file  Other Topics Concern   Not on file  Social History Narrative   Not on file   Social Determinants of Health   Financial Resource Strain: Not on file  Food Insecurity: Not on file  Transportation Needs: Not on file  Physical Activity: Not on file  Stress: Not on file  Social Connections: Not on file  Intimate Partner Violence: Not on file     Review of Systems: General: negative for chills, fever, night sweats or weight changes.  Cardiovascular: negative for chest pain, dyspnea on exertion, edema, orthopnea, palpitations, paroxysmal nocturnal dyspnea or shortness of breath Dermatological: negative for rash Respiratory: negative for cough or wheezing Urologic: negative for hematuria Abdominal: negative for nausea, vomiting, diarrhea, bright red blood per rectum, melena, or hematemesis Neurologic: negative for visual changes, syncope, or dizziness All other systems reviewed and are otherwise negative except as noted above.    Blood pressure 126/82, pulse 69, resp. rate 20, height 5\' 10"  (1.778 m),  weight 186 lb 9.6 oz (84.6 kg), SpO2 95 %.  General appearance: alert and no distress Neck: no adenopathy, no carotid bruit, no JVD, supple, symmetrical, trachea midline, and thyroid not enlarged, symmetric, no tenderness/mass/nodules Lungs: clear to auscultation bilaterally Heart: regular rate and rhythm, S1, S2 normal, no murmur, click, rub or gallop Extremities: extremities normal, atraumatic, no cyanosis or edema Pulses: 2+ and symmetric Skin: Skin color, texture, turgor normal. No rashes or lesions Neurologic: Grossly normal  EKG sinus rhythm at 69 without ST or T wave changes.  I personally reviewed this EKG.  ASSESSMENT AND PLAN:    Syncope and collapse Mr. Hearn was referred to me by Dr. Tobie Poet for evaluation of syncope.  His first episode occurred on 01/31/2021 while in [place].  He had loss of consciousness late at night.  He had [place] the [place] on the beach but apparently was well-hydrated.  He had several beers that night.  He fell down and hit his head and was seen by EMS and taken to the ER for a brief evaluation but not admitted.  He had a second episode of syncope 07/29/2021 when he got up to get something from the refrigerator at approxi-12:30 in the morning and suddenly passed out without warning.  He had no other symptoms.  I am going to get a 2D echo and a 2-week Zio patch to further evaluate.  I did tell him that since we do not have a diagnosis below requires that he should not drive for 6 months.     Lorretta Harp MD FACP,FACC,FAHA, [facility] 08/19/2021 3:15 PM

## 2021-08-22 DIAGNOSIS — R55 Syncope and collapse: Secondary | ICD-10-CM | POA: Diagnosis not present

## 2021-09-08 ENCOUNTER — Other Ambulatory Visit: Payer: Self-pay

## 2021-09-08 ENCOUNTER — Ambulatory Visit (HOSPITAL_COMMUNITY): Attending: Internal Medicine

## 2021-09-08 DIAGNOSIS — R55 Syncope and collapse: Secondary | ICD-10-CM | POA: Diagnosis not present

## 2021-09-08 LAB — ECHOCARDIOGRAM COMPLETE
Area-P 1/2: 3.77 cm2
S' Lateral: 3.4 cm

## 2021-09-14 ENCOUNTER — Encounter: Payer: Self-pay | Admitting: Cardiovascular Disease

## 2021-09-26 NOTE — Telephone Encounter (Signed)
Appointment scheduled for pt.

## 2021-10-07 ENCOUNTER — Ambulatory Visit (INDEPENDENT_AMBULATORY_CARE_PROVIDER_SITE_OTHER): Admitting: Cardiovascular Disease

## 2021-10-07 ENCOUNTER — Encounter: Payer: Self-pay | Admitting: Cardiovascular Disease

## 2021-10-07 ENCOUNTER — Other Ambulatory Visit: Payer: Self-pay

## 2021-10-07 VITALS — BP 112/70 | HR 73 | Resp 20 | Ht 70.0 in | Wt 186.0 lb

## 2021-10-07 DIAGNOSIS — R55 Syncope and collapse: Secondary | ICD-10-CM | POA: Diagnosis not present

## 2021-10-07 LAB — COMPREHENSIVE METABOLIC PANEL
ALT: 25 IU/L (ref 0–44)
AST: 17 IU/L (ref 0–40)
Albumin/Globulin Ratio: 2.2 (ref 1.2–2.2)
Albumin: 4.7 g/dL (ref 3.8–4.9)
Alkaline Phosphatase: 69 IU/L (ref 44–121)
BUN/Creatinine Ratio: 9 (ref 9–20)
BUN: 10 mg/dL (ref 6–24)
Bilirubin Total: 0.9 mg/dL (ref 0.0–1.2)
CO2: 29 mmol/L (ref 20–29)
Calcium: 9.8 mg/dL (ref 8.7–10.2)
Chloride: 98 mmol/L (ref 96–106)
Creatinine, Ser: 1.08 mg/dL (ref 0.76–1.27)
Globulin, Total: 2.1 g/dL (ref 1.5–4.5)
Glucose: 102 mg/dL — ABNORMAL HIGH (ref 70–99)
Potassium: 4.4 mmol/L (ref 3.5–5.2)
Sodium: 139 mmol/L (ref 134–144)
Total Protein: 6.8 g/dL (ref 6.0–8.5)
eGFR: 79 mL/min/{1.73_m2} (ref >59)

## 2021-10-07 LAB — LIPID PANEL
Chol/HDL Ratio: 3 ratio (ref 0.0–5.0)
Cholesterol, Total: 158 mg/dL (ref 100–199)
HDL: 52 mg/dL (ref >39)
LDL Chol Calc (NIH): 93 mg/dL (ref 0–99)
Triglycerides: 64 mg/dL (ref 0–149)
VLDL Cholesterol Cal: 13 mg/dL (ref 5–40)

## 2021-10-07 LAB — CBC
Hematocrit: 43.1 % (ref 37.5–51.0)
Hemoglobin: 14.9 g/dL (ref 13.0–17.7)
MCH: 33.2 pg — ABNORMAL HIGH (ref 26.6–33.0)
MCHC: 34.6 g/dL (ref 31.5–35.7)
MCV: 96 fL (ref 79–97)
Platelets: 232 10*3/uL (ref 150–450)
RBC: 4.49 x10E6/uL (ref 4.14–5.80)
RDW: 11.5 % — ABNORMAL LOW (ref 11.6–15.4)
WBC: 10.8 10*3/uL (ref 3.4–10.8)

## 2021-10-07 LAB — PSA: Prostate Specific Ag, Serum: 2.5 ng/mL (ref 0.0–4.0)

## 2021-10-07 LAB — TSH+FREE T4
Free T4: 1.25 ng/dL (ref 0.82–1.77)
TSH: 0.746 u[IU]/mL (ref 0.450–4.500)

## 2021-10-07 NOTE — Progress Notes (Signed)
Edward Hoover returns today to follow-up his outpatient test done in the evaluation of syncope.  His 2D echo was essentially normal with grade 1 diastolic dysfunction.  His event monitor resulting 09/14/2021 revealed frequent PVCs and and trigeminal pattern, short runs of SVT and nonsustained ventricular tachycardia none of which could have been responsible for syncopal episode and he has been asymptomatic since.  We did talk about not driving for 6 months from his last episode.  He does drink 10-15 beers a week, at most 3:57 at night.  I am going to get routine labs on him including basic metabolic panel, CBC, thyroid function test, lipid and liver profile and PSA.  He is apparently seeing a new primary care physician in [place] in the near future.  I will see him back in 1 year for follow-up.   Lorretta Harp, M.D., [place], [facility], [place] [address] [place], [place]  [address]  [phone] 10/07/2021 9:40 AM

## 2021-10-07 NOTE — Patient Instructions (Signed)
Medication Instructions:  Your physician recommends that you continue on your current medications as directed. Please refer to the Current Medication list given to you today.  *If you need a refill on your cardiac medications before your next appointment, please call your pharmacy*   Lab Work: Your physician recommends that you have labs drawn today: CMET, CBC, Lipids, PSA, TSH/Free T4  If you have labs (blood work) drawn today and your tests are completely normal, you will receive your results only by: [place] (if you have MyChart) OR A paper copy in the mail If you have any lab test that is abnormal or we need to change your treatment, we will call you to review the results.   Follow-Up: At [facility], you and your health needs are our priority.  As part of our continuing mission to provide you with exceptional heart care, we have created designated Provider Care Teams.  These Care Teams include your primary Cardiologist (physician) and Advanced Practice Providers (APPs -  Physician Assistants and Nurse Practitioners) who all work together to provide you with the care you need, when you need it.  We recommend signing up for the patient portal called "MyChart".  Sign up information is provided on this After Visit Summary.  MyChart is used to connect with patients for Virtual Visits (Telemedicine).  Patients are able to view lab/test results, encounter notes, upcoming appointments, etc.  Non-urgent messages can be sent to your provider as well.   To learn more about what you can do with MyChart, go to [email or web address].    Your next appointment:   12 month(s)  The format for your next appointment:   In Person  Provider:   Quay Burow, MD

## 2022-02-07 ENCOUNTER — Ambulatory Visit: Admitting: Cardiovascular Disease

## 2023-04-20 ENCOUNTER — Other Ambulatory Visit: Payer: Self-pay

## 2023-04-20 ENCOUNTER — Emergency Department (HOSPITAL_BASED_OUTPATIENT_CLINIC_OR_DEPARTMENT_OTHER)

## 2023-04-20 ENCOUNTER — Encounter (HOSPITAL_BASED_OUTPATIENT_CLINIC_OR_DEPARTMENT_OTHER): Payer: Self-pay

## 2023-04-20 ENCOUNTER — Emergency Department (HOSPITAL_BASED_OUTPATIENT_CLINIC_OR_DEPARTMENT_OTHER)
Admission: EM | Admit: 2023-04-20 | Discharge: 2023-04-20 | Disposition: A | Discharge status: Home / Self Care | Attending: Emergency Medicine | Admitting: Emergency Medicine

## 2023-04-20 DIAGNOSIS — K5792 Diverticulitis of intestine, part unspecified, without perforation or abscess without bleeding: Secondary | ICD-10-CM

## 2023-04-20 DIAGNOSIS — R109 Unspecified abdominal pain: Secondary | ICD-10-CM | POA: Diagnosis present

## 2023-04-20 DIAGNOSIS — Z1152 Encounter for screening for COVID-19: Secondary | ICD-10-CM | POA: Diagnosis not present

## 2023-04-20 DIAGNOSIS — K5732 Diverticulitis of large intestine without perforation or abscess without bleeding: Secondary | ICD-10-CM | POA: Insufficient documentation

## 2023-04-20 LAB — CBC
HCT: 42.1 % (ref 39.0–52.0)
Hemoglobin: 15.2 g/dL (ref 13.0–17.0)
MCH: 34.5 pg — ABNORMAL HIGH (ref 26.0–34.0)
MCHC: 36.1 g/dL — ABNORMAL HIGH (ref 30.0–36.0)
MCV: 95.5 fL (ref 80.0–100.0)
Platelets: 265 10*3/uL (ref 150–400)
RBC: 4.41 MIL/uL (ref 4.22–5.81)
RDW: 11 % — ABNORMAL LOW (ref 11.5–15.5)
WBC: 15.1 10*3/uL — ABNORMAL HIGH (ref 4.0–10.5)
nRBC: 0 % (ref 0.0–0.2)

## 2023-04-20 LAB — COMPREHENSIVE METABOLIC PANEL
ALT: 28 U/L (ref 0–44)
AST: 20 U/L (ref 15–41)
Albumin: 4 g/dL (ref 3.5–5.0)
Alkaline Phosphatase: 52 U/L (ref 38–126)
Anion gap: 10 (ref 5–15)
BUN: 12 mg/dL (ref 8–23)
CO2: 25 mmol/L (ref 22–32)
Calcium: 9.3 mg/dL (ref 8.9–10.3)
Chloride: 101 mmol/L (ref 98–111)
Creatinine, Ser: 0.89 mg/dL (ref 0.61–1.24)
GFR, Estimated: >60 mL/min (ref >60)
Glucose, Bld: 118 mg/dL — ABNORMAL HIGH (ref 70–99)
Potassium: 3.7 mmol/L (ref 3.5–5.1)
Sodium: 136 mmol/L (ref 135–145)
Total Bilirubin: 1.2 mg/dL (ref 0.3–1.2)
Total Protein: 7.3 g/dL (ref 6.5–8.1)

## 2023-04-20 LAB — URINALYSIS, ROUTINE W REFLEX MICROSCOPIC
Glucose, UA: NEGATIVE mg/dL
Hgb urine dipstick: NEGATIVE
Ketones, ur: NEGATIVE mg/dL
Leukocytes,Ua: NEGATIVE
Nitrite: NEGATIVE
Protein, ur: 100 mg/dL — AB
Specific Gravity, Urine: >=1.03 (ref 1.005–1.030)
pH: 6 (ref 5.0–8.0)

## 2023-04-20 LAB — URINALYSIS, MICROSCOPIC (REFLEX): Squamous Epithelial / HPF: NONE SEEN /HPF (ref 0–5)

## 2023-04-20 LAB — SARS CORONAVIRUS 2 BY RT PCR: SARS Coronavirus 2 by RT PCR: NEGATIVE

## 2023-04-20 LAB — LIPASE, BLOOD: Lipase: 23 U/L (ref 11–51)

## 2023-04-20 MED ORDER — ONDANSETRON HCL 4 MG/2ML IJ SOLN
4.0000 mg | Freq: Once | INTRAMUSCULAR | Status: AC
  Administered 2023-04-20: 4 mg via INTRAVENOUS
  Filled 2023-04-20: qty 2

## 2023-04-20 MED ORDER — AMOXICILLIN-POT CLAVULANATE 875-125 MG PO TABS
1.0000 | ORAL_TABLET | Freq: Once | ORAL | Status: AC
  Administered 2023-04-20: 1 via ORAL
  Filled 2023-04-20: qty 1

## 2023-04-20 MED ORDER — IOHEXOL 300 MG/ML  SOLN
100.0000 mL | Freq: Once | INTRAMUSCULAR | Status: AC | PRN
  Administered 2023-04-20: 80 mL via INTRAVENOUS

## 2023-04-20 MED ORDER — ONDANSETRON HCL 4 MG PO TABS: 4.0000 mg | ORAL_TABLET | Freq: Four times a day (QID) | ORAL | 0 refills | Status: DC | PRN

## 2023-04-20 MED ORDER — LACTATED RINGERS IV BOLUS
1000.0000 mL | Freq: Once | INTRAVENOUS | Status: AC
  Administered 2023-04-20: 1000 mL via INTRAVENOUS

## 2023-04-20 MED ORDER — AMOXICILLIN-POT CLAVULANATE 875-125 MG PO TABS: 1.0000 | ORAL_TABLET | Freq: Two times a day (BID) | ORAL | 0 refills | Status: AC

## 2023-04-20 MED ORDER — OXYCODONE HCL 5 MG PO TABS: 5.0000 mg | ORAL_TABLET | ORAL | 0 refills | Status: DC | PRN

## 2023-04-20 MED ORDER — MORPHINE SULFATE (PF) 4 MG/ML IV SOLN
6.0000 mg | Freq: Once | INTRAVENOUS | Status: AC
  Administered 2023-04-20: 6 mg via INTRAVENOUS
  Filled 2023-04-20 (×2): qty 2

## 2023-04-20 NOTE — ED Provider Notes (Signed)
[place] EMERGENCY DEPARTMENT AT [facility] Provider Note   CSN: [id number] Arrival date & time: 04/20/23  1705     History Chief Complaint  Patient presents with   Abdominal Pain   Constipation    HPI Edward Hoover. is a 62 y.o. male presenting for abdominal pain, inability to defecate.  62 year old male significant medical history of multiple abdominal surgeries due to trauma as a child.  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 States that he is returning today with similar symptoms poor p.o. intake x 4 days, 10 pound weight loss over the last 4 days, severe nausea with any p.o. intake complete intolerance of solids.  Feels dehydrated.  Denies fevers chills or diarrhea.   Patient's recorded medical, surgical, social, medication list and allergies were reviewed in the Snapshot window as part of the initial history.   Review of Systems   Review of Systems  Constitutional:  Negative for chills and fever.  HENT:  Negative for ear pain and sore throat.   Eyes:  Negative for pain and visual disturbance.  Respiratory:  Negative for cough and shortness of breath.   Cardiovascular:  Negative for chest pain and palpitations.  Gastrointestinal:  Positive for abdominal pain and nausea. Negative for vomiting.  Genitourinary:  Negative for dysuria and hematuria.  Musculoskeletal:  Negative for arthralgias and back pain.  Skin:  Negative for color change and rash.  Neurological:  Negative for seizures and syncope.  All other systems reviewed and are negative.   Physical Exam Updated Vital Signs BP 119/77 (BP Location: Left Arm)   Pulse 86   Temp 99.1 F (37.3 C) (Oral)   Resp 17   Ht 5\' 10"  (1.778 m)   Wt 84 kg   SpO2 99%   BMI 26.57 kg/m  Physical Exam Vitals and nursing note reviewed.  Constitutional:      General: He is not in acute distress.    Appearance: He is well-developed.  HENT:      Head: Normocephalic and atraumatic.  Eyes:     Conjunctiva/sclera: Conjunctivae normal.  Cardiovascular:     Rate and Rhythm: Normal rate and regular rhythm.     Heart sounds: No murmur heard. Pulmonary:     Effort: Pulmonary effort is normal. No respiratory distress.     Breath sounds: Normal breath sounds.  Abdominal:     Palpations: Abdomen is soft.     Tenderness: There is abdominal tenderness. There is no guarding.  Musculoskeletal:        General: No swelling.     Cervical back: Neck supple.  Skin:    General: Skin is warm and dry.     Capillary Refill: Capillary refill takes less than 2 seconds.  Neurological:     Mental Status: He is alert.  Psychiatric:        Mood and Affect: Mood normal.      ED Course/ Medical Decision Making/ A&P    Procedures Procedures   Medications Ordered in ED Medications  morphine (PF) 4 MG/ML injection 6 mg (6 mg Intravenous Given 04/20/23 2006)  ondansetron (ZOFRAN) injection 4 mg (4 mg Intravenous Given 04/20/23 2007)  lactated ringers bolus 1,000 mL (0 mLs Intravenous Stopped 04/20/23 2131)  iohexol (OMNIPAQUE) 300 MG/ML solution 100 mL (80 mLs Intravenous Contrast Given 04/20/23 2049)  amoxicillin-clavulanate (AUGMENTIN) 875-125 MG per tablet 1 tablet (1 tablet Oral Given 04/20/23 2139)  ondansetron (ZOFRAN)  injection 4 mg (4 mg Intravenous Given 04/20/23 2159)   Medical Decision Making:   Edward Hoover. is a 62 y.o. male who presented to the ED today with abdominal pain, detailed above.    Patient's presentation is complicated by their history of multiple comorbid medical problems.  Patient placed on continuous vitals and telemetry monitoring while in ED which was reviewed periodically.  Complete initial physical exam performed, notably the patient  was with mild tenderness worse in the left lower quadrant.     Reviewed and confirmed nursing documentation for past medical history, family history, social history.     Initial Assessment:   With the patient's presentation of abdominal pain, most likely diagnosis is nonspecific etiology. Other diagnoses were considered including (but not limited to) gastroenteritis, colitis, small bowel obstruction, appendicitis, cholecystitis, pancreatitis, nephrolithiasis, UTI, pyleonephritis. These are considered less likely due to history of present illness and physical exam findings.   This is most consistent with an acute life/limb threatening illness complicated by underlying chronic conditions.   Initial Plan:  CBC/CMP to evaluate for underlying infectious/metabolic etiology for patient's abdominal pain  Lipase to evaluate for pancreatitis  CTAB/Pelvis with contrast to evaluate for structural/surgical etiology of patients' severe abdominal pain.  Urinalysis and repeat physical assessment to evaluate for UTI/Pyelonpehritis  Empiric management of symptoms with escalating pain control and antiemetics as needed.   Initial Study Results:   Laboratory  All laboratory results reviewed without evidence of clinically relevant pathology.   Exceptions include: Leukocytosis   Radiology All images reviewed independently. Agree with radiology report at this time.   CT ABDOMEN PELVIS W CONTRAST  Result Date: 04/20/2023 CLINICAL DATA:  Abdominal pain and constipation. EXAM: CT ABDOMEN AND PELVIS WITH CONTRAST TECHNIQUE: Multidetector CT imaging of the abdomen and pelvis was performed using the standard protocol following bolus administration of intravenous contrast. RADIATION DOSE REDUCTION: This exam was performed according to the departmental dose-optimization program which includes automated exposure control, adjustment of the mA and/or kV according to patient size and/or use of iterative reconstruction technique. CONTRAST:  80mL OMNIPAQUE IOHEXOL 300 MG/ML  SOLN COMPARISON:  01/18/2018. FINDINGS: Lower chest: Mild dependent atelectasis is noted at the lung bases. Hepatobiliary:  Multiple hypodensities are present in the liver, the largest is cystic in attenuation. No biliary ductal dilatation. The gallbladder is without stones. Pancreas: The pancreatic head is within normal limits. The pancreatic tail and body are not visualized on exam. No pancreatic ductal dilatation or surrounding inflammatory changes. Spleen: Normal in size without focal abnormality. Adrenals/Urinary Tract: The adrenal glands are within normal limits. Marked right renal atrophy is noted. There is normal enhancement of the left kidney. A few scattered subcentimeter hypodensities are noted in the left kidney which are too small to further characterize. No renal calculus or hydronephrosis. The bladder is unremarkable. Stomach/Bowel: The stomach is within normal limits. No bowel obstruction, free air, or pneumatosis. Scattered diverticula are present along the sigmoid colon with bowel wall thickening and surrounding fat stranding, compatible with diverticulitis. The appendix appears normal. Vascular/Lymphatic: Aortic atherosclerosis. No enlarged abdominal or pelvic lymph nodes. Reproductive: The prostate gland is enlarged. Other: Fat containing inguinal hernia is noted on the right. No abdominopelvic ascites. Musculoskeletal: Degenerative changes are noted in the thoracolumbar spine. No acute osseous abnormality. IMPRESSION: 1. Findings compatible with sigmoid diverticulitis. No abscess or free air. 2. Chronic right renal atrophy. 3. 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 4. Enlarged prostate gland. 5. Aortic atherosclerosis. Electronically Signed  By: Thornell Sartorius M.D.   On: 04/20/2023 21:01    Final Reassessment and Plan:   Patient is present on his physicals and findings are consistent with diverticulitis.  No abscess identified on CT scan.  He is in no acute distress.  Treated with antibiosis in the emergency room and tolerating.  Will keep him on as needed supportive care with pain control, emesis  control and antibiotics with strict return precautions reinforced. Disposition:  I have considered need for hospitalization, however, considering all of the above, I believe this patient is stable for discharge at this time.  Patient/family educated about specific return precautions for given chief complaint and symptoms.  Patient/family educated about follow-up with PCP .     Patient/family expressed understanding of return precautions and need for follow-up. Patient spoken to regarding all imaging and laboratory results and appropriate follow up for these results. All education provided in verbal form with additional information in written form. Time was allowed for answering of patient questions. Patient discharged.    Emergency Department Medication Summary:   Medications  morphine (PF) 4 MG/ML injection 6 mg (6 mg Intravenous Given 04/20/23 2006)  ondansetron (ZOFRAN) injection 4 mg (4 mg Intravenous Given 04/20/23 2007)  lactated ringers bolus 1,000 mL (0 mLs Intravenous Stopped 04/20/23 2131)  iohexol (OMNIPAQUE) 300 MG/ML solution 100 mL (80 mLs Intravenous Contrast Given 04/20/23 2049)  amoxicillin-clavulanate (AUGMENTIN) 875-125 MG per tablet 1 tablet (1 tablet Oral Given 04/20/23 2139)  ondansetron (ZOFRAN) injection 4 mg (4 mg Intravenous Given 04/20/23 2159)     Clinical Impression:  1. Diverticulitis      Discharge   Final Clinical Impression(s) / ED Diagnoses Final diagnoses:  Diverticulitis    Rx / DC Orders ED Discharge Orders          Ordered    ondansetron (ZOFRAN) 4 MG tablet  Every 6 hours PRN        04/20/23 2154    amoxicillin-clavulanate (AUGMENTIN) 875-125 MG tablet  Every 12 hours        04/20/23 2154    oxyCODONE (ROXICODONE) 5 MG immediate release tablet  Every 4 hours PRN        04/20/23 2154              Glyn Ade, MD 04/20/23 2215

## 2023-04-20 NOTE — ED Triage Notes (Signed)
Patient has been having abd pain and constipation for 4 days. He stated it feels like when he has had a bowel obstruction. He denied vomiting or fever.

## 2023-07-10 ENCOUNTER — Emergency Department (HOSPITAL_COMMUNITY)

## 2023-07-10 ENCOUNTER — Observation Stay (HOSPITAL_COMMUNITY)
Admission: EM | Admit: 2023-07-10 | Discharge: 2023-07-11 | Disposition: A | Discharge status: Home / Self Care | Attending: Family Medicine | Admitting: Family Medicine

## 2023-07-10 ENCOUNTER — Encounter (HOSPITAL_COMMUNITY): Payer: Self-pay

## 2023-07-10 ENCOUNTER — Other Ambulatory Visit: Payer: Self-pay

## 2023-07-10 DIAGNOSIS — Z7901 Long term (current) use of anticoagulants: Secondary | ICD-10-CM | POA: Insufficient documentation

## 2023-07-10 DIAGNOSIS — Z720 Tobacco use: Secondary | ICD-10-CM | POA: Insufficient documentation

## 2023-07-10 DIAGNOSIS — I959 Hypotension, unspecified: Secondary | ICD-10-CM | POA: Insufficient documentation

## 2023-07-10 DIAGNOSIS — F1090 Alcohol use, unspecified, uncomplicated: Secondary | ICD-10-CM | POA: Diagnosis not present

## 2023-07-10 DIAGNOSIS — G459 Transient cerebral ischemic attack, unspecified: Secondary | ICD-10-CM | POA: Diagnosis not present

## 2023-07-10 DIAGNOSIS — F32A Depression, unspecified: Secondary | ICD-10-CM | POA: Diagnosis not present

## 2023-07-10 DIAGNOSIS — D72829 Elevated white blood cell count, unspecified: Secondary | ICD-10-CM | POA: Diagnosis not present

## 2023-07-10 DIAGNOSIS — C3411 Malignant neoplasm of upper lobe, right bronchus or lung: Secondary | ICD-10-CM | POA: Insufficient documentation

## 2023-07-10 DIAGNOSIS — K219 Gastro-esophageal reflux disease without esophagitis: Secondary | ICD-10-CM | POA: Diagnosis not present

## 2023-07-10 DIAGNOSIS — Z79899 Other long term (current) drug therapy: Secondary | ICD-10-CM | POA: Diagnosis not present

## 2023-07-10 DIAGNOSIS — N400 Enlarged prostate without lower urinary tract symptoms: Secondary | ICD-10-CM | POA: Diagnosis not present

## 2023-07-10 DIAGNOSIS — J309 Allergic rhinitis, unspecified: Secondary | ICD-10-CM | POA: Diagnosis present

## 2023-07-10 DIAGNOSIS — G453 Amaurosis fugax: Principal | ICD-10-CM

## 2023-07-10 DIAGNOSIS — R4701 Aphasia: Secondary | ICD-10-CM | POA: Insufficient documentation

## 2023-07-10 DIAGNOSIS — H53142 Visual discomfort, left eye: Secondary | ICD-10-CM | POA: Diagnosis present

## 2023-07-10 DIAGNOSIS — R6510 Systemic inflammatory response syndrome (SIRS) of non-infectious origin without acute organ dysfunction: Secondary | ICD-10-CM | POA: Diagnosis present

## 2023-07-10 DIAGNOSIS — H53462 Homonymous bilateral field defects, left side: Secondary | ICD-10-CM

## 2023-07-10 DIAGNOSIS — I9589 Other hypotension: Secondary | ICD-10-CM

## 2023-07-10 DIAGNOSIS — J301 Allergic rhinitis due to pollen: Secondary | ICD-10-CM

## 2023-07-10 LAB — CBC WITH DIFFERENTIAL/PLATELET
Abs Immature Granulocytes: 0.05 10*3/uL (ref 0.00–0.07)
Basophils Absolute: 0 10*3/uL (ref 0.0–0.1)
Basophils Relative: 0 %
Eosinophils Absolute: 0 10*3/uL (ref 0.0–0.5)
Eosinophils Relative: 0 %
HCT: 42.6 % (ref 39.0–52.0)
Hemoglobin: 14.7 g/dL (ref 13.0–17.0)
Immature Granulocytes: 0 %
Lymphocytes Relative: 19 %
Lymphs Abs: 2.3 10*3/uL (ref 0.7–4.0)
MCH: 33 pg (ref 26.0–34.0)
MCHC: 34.5 g/dL (ref 30.0–36.0)
MCV: 95.7 fL (ref 80.0–100.0)
Monocytes Absolute: 0.6 10*3/uL (ref 0.1–1.0)
Monocytes Relative: 5 %
Neutro Abs: 9.1 10*3/uL — ABNORMAL HIGH (ref 1.7–7.7)
Neutrophils Relative %: 76 %
Platelets: 248 10*3/uL (ref 150–400)
RBC: 4.45 MIL/uL (ref 4.22–5.81)
RDW: 11.8 % (ref 11.5–15.5)
WBC: 12.1 10*3/uL — ABNORMAL HIGH (ref 4.0–10.5)
nRBC: 0 % (ref 0.0–0.2)

## 2023-07-10 LAB — COMPREHENSIVE METABOLIC PANEL
ALT: 21 U/L (ref 0–44)
AST: 16 U/L (ref 15–41)
Albumin: 4.1 g/dL (ref 3.5–5.0)
Alkaline Phosphatase: 50 U/L (ref 38–126)
Anion gap: 9 (ref 5–15)
BUN: 12 mg/dL (ref 8–23)
CO2: 25 mmol/L (ref 22–32)
Calcium: 9.3 mg/dL (ref 8.9–10.3)
Chloride: 102 mmol/L (ref 98–111)
Creatinine, Ser: 1.09 mg/dL (ref 0.61–1.24)
GFR, Estimated: >60 mL/min (ref >60)
Glucose, Bld: 96 mg/dL (ref 70–99)
Potassium: 3.6 mmol/L (ref 3.5–5.1)
Sodium: 136 mmol/L (ref 135–145)
Total Bilirubin: 0.7 mg/dL (ref <1.2)
Total Protein: 6.8 g/dL (ref 6.5–8.1)

## 2023-07-10 LAB — RAPID URINE DRUG SCREEN, HOSP PERFORMED
Amphetamines: NOT DETECTED
Barbiturates: NOT DETECTED
Benzodiazepines: NOT DETECTED
Cocaine: NOT DETECTED
Opiates: NOT DETECTED
Tetrahydrocannabinol: POSITIVE — AB

## 2023-07-10 LAB — URINALYSIS, COMPLETE (UACMP) WITH MICROSCOPIC
Bacteria, UA: NONE SEEN
Bilirubin Urine: NEGATIVE
Glucose, UA: NEGATIVE mg/dL
Hgb urine dipstick: NEGATIVE
Ketones, ur: NEGATIVE mg/dL
Leukocytes,Ua: NEGATIVE
Nitrite: NEGATIVE
Protein, ur: NEGATIVE mg/dL
Specific Gravity, Urine: 1.009 (ref 1.005–1.030)
pH: 7 (ref 5.0–8.0)

## 2023-07-10 LAB — CBG MONITORING, ED: Glucose-Capillary: 91 mg/dL (ref 70–99)

## 2023-07-10 LAB — HEMOGLOBIN A1C
Hgb A1c MFr Bld: 5 % (ref 4.8–5.6)
Mean Plasma Glucose: 96.8 mg/dL

## 2023-07-10 LAB — I-STAT CG4 LACTIC ACID, ED
Lactic Acid, Venous: 1.3 mmol/L (ref 0.5–1.9)
Lactic Acid, Venous: 1.7 mmol/L (ref 0.5–1.9)

## 2023-07-10 LAB — TROPONIN I (HIGH SENSITIVITY)
Troponin I (High Sensitivity): 5 ng/L (ref <18)
Troponin I (High Sensitivity): 5 ng/L (ref <18)

## 2023-07-10 LAB — MAGNESIUM: Magnesium: 1.7 mg/dL (ref 1.7–2.4)

## 2023-07-10 MED ORDER — HYDRALAZINE HCL 20 MG/ML IJ SOLN: 10.0000 mg | INTRAMUSCULAR | Status: DC | PRN

## 2023-07-10 MED ORDER — SODIUM CHLORIDE 0.9 % IV BOLUS
1000.0000 mL | Freq: Once | INTRAVENOUS | Status: AC
  Administered 2023-07-10: 1000 mL via INTRAVENOUS

## 2023-07-10 MED ORDER — ONDANSETRON HCL 4 MG/2ML IJ SOLN: 4.0000 mg | Freq: Four times a day (QID) | INTRAMUSCULAR | Status: DC | PRN

## 2023-07-10 MED ORDER — LORATADINE 10 MG PO TABS
10.0000 mg | ORAL_TABLET | Freq: Every day | ORAL | Status: DC
  Administered 2023-07-11: 10 mg via ORAL
  Filled 2023-07-10: qty 1

## 2023-07-10 MED ORDER — STROKE: EARLY STAGES OF RECOVERY BOOK
Freq: Once | Status: AC
  Filled 2023-07-10: qty 1

## 2023-07-10 MED ORDER — TAMSULOSIN HCL 0.4 MG PO CAPS
0.4000 mg | ORAL_CAPSULE | Freq: Every day | ORAL | Status: DC
  Administered 2023-07-11 (×2): 0.4 mg via ORAL
  Filled 2023-07-10 (×2): qty 1

## 2023-07-10 MED ORDER — ARIPIPRAZOLE 10 MG PO TABS
10.0000 mg | ORAL_TABLET | Freq: Every day | ORAL | Status: DC
  Administered 2023-07-11: 10 mg via ORAL
  Filled 2023-07-10: qty 1

## 2023-07-10 MED ORDER — IOHEXOL 350 MG/ML SOLN
75.0000 mL | Freq: Once | INTRAVENOUS | Status: AC | PRN
  Administered 2023-07-10: 75 mL via INTRAVENOUS

## 2023-07-10 MED ORDER — SODIUM CHLORIDE 0.9 % IV SOLN
INTRAVENOUS | Status: AC
Start: 2023-07-10 — End: 2023-07-11

## 2023-07-10 MED ORDER — ACETAMINOPHEN 325 MG PO TABS
650.0000 mg | ORAL_TABLET | Freq: Four times a day (QID) | ORAL | Status: DC | PRN
  Administered 2023-07-10: 650 mg via ORAL
  Filled 2023-07-10: qty 2

## 2023-07-10 MED ORDER — LORAZEPAM 2 MG/ML IJ SOLN
2.0000 mg | Freq: Once | INTRAMUSCULAR | Status: AC | PRN
  Administered 2023-07-11: 2 mg via INTRAVENOUS
  Filled 2023-07-10: qty 1

## 2023-07-10 MED ORDER — PANTOPRAZOLE SODIUM 40 MG PO TBEC
40.0000 mg | DELAYED_RELEASE_TABLET | Freq: Every day | ORAL | Status: DC
  Administered 2023-07-11: 40 mg via ORAL
  Filled 2023-07-10: qty 1

## 2023-07-10 MED ORDER — ACETAMINOPHEN 650 MG RE SUPP: 650.0000 mg | Freq: Four times a day (QID) | RECTAL | Status: DC | PRN

## 2023-07-10 MED ORDER — MELATONIN 3 MG PO TABS: 3.0000 mg | ORAL_TABLET | Freq: Every evening | ORAL | Status: DC | PRN

## 2023-07-10 NOTE — ED Triage Notes (Signed)
Blurred vision, difficulty forming words, dizziness and had a near syncopal episode while at work. Blurred vision resolved. Alert and oriented x 4.   BP 104/68 RR 16 98% on RA HR 60

## 2023-07-10 NOTE — H&P (Incomplete Revision)
History and Physical      Edward Hoover. XBM:[id number] DOB: Dec 17, 1960 DOA: 07/10/2023; DOS: 07/10/2023  PCP: Edward Rossetti, PA-C  Patient coming from: home   I have personally briefly reviewed patient's old medical records in [facility]  Chief Complaint: Left-sided blurry vision  HPI: Edward Hoover. is a 62 y.o. male with medical history significant for depression, BPH, who is admitted to [facility] on 07/10/2023 for further TIA/stroke workup after presenting from home to [facility] ED complaining of left-sided blurry vision.   The patient reports that he was at his baseline level of health, when he had sudden onset at 1500 today while at work of left-sided blurry vision.  He reports that the left-sided blurry vision involved by both eyes, noting that when he covered his right eye he had blurry vision in the lateral aspect of the left eye, will also noting when he covered his left eye he maintained blurry vision in the left portion of his visual field with using only his right eye.  He notes that this was associate with some lightheadedness as well as " difficulty getting words out" and some dysarthria.  He also noted some associated lightheadedness, but in the absence of subjective sensation of impending loss of consciousness nor any formal loss of consciousness or any fall/trauma.  Denies any associated acute focal weakness, paresthesias, numbness, dysphagia, vertigo, nausea, vomiting, facial droop, or headache.    The above symptoms persisted for a few minutes, before spontaneously resolving.   Denies any associated chest pain, shortness of breath, palpitations, diaphoresis.   He also denies any recent subjective fever, chills, rigors.  No recent abdominal discomfort, dysuria, neck stiffness, rash.  No recent cough or hemoptysis nor any recent calf tenderness or new lower extremity erythema.  Denies any recent new onset peripheral edema.  In terms of his  outpatient medications with antihypertensive ramifications, he is on Flomax in the setting of BPH.   Denies any previous history of stroke. Medical/social history notable for no known history of hypertension, hyperlipidemia, diabetes, paroxysmal atrial fibrillation or any known history of obstructive sleep apnea.  Conveys that he is a lifelong non-smoker.  Not currently on any blood thinners as an outpatient, including no aspirin, noting that he has a history of anaphylactic response to NSAIDs.  Not currently on any antilipid medications at home.  In the setting of the above symptoms, the patient contacted EMS, who reportedly noted the patient to have soft initial blood pressures, prompting an undisclosed volume of IV fluids administered via EMS and relative to his current emergency department.  However, it was noted that the patient's symptoms resolved even with persistence of his soft blood pressures.    ED Course:  Vital signs in the ED were notable for the following: Afebrile; rates in the 50s to 60s; initial blood pressure 83/58, [place] improving to 111/72 following interval 1 L bolus of NS; respiratory rate 16-22, oxygen saturation 99 to 100% on room air.  Labs were notable for the following: CMP notable for the following: Sodium 136, bicarbonate 25, creatinine 1.09 compared to most recent prior serum creatinine did point of 0.89 on 04/20/2023, glucose 96, liver enzymes within normal limits.  High-sensitivity troponin I x 2 values were both on V5.  CBC notable for white cell count 12,100 with 76% neutrophils, hemoglobin 14.7.  Lactic acid 1.3.  Per my interpretation, EKG in ED demonstrated the following: Sinus bradycardia with heart rate 54, normal levels,  no evidence of T wave or ST changes, including no evidence of ST elevation.  Imaging in the ED, per corresponding formal radiology read, was notable for the following: 1 view chest x-ray showed no evidence of acute cardiopulmonary process, Cleen  evidence of endotracheal edema, effusion, pneumothorax.  CT head showed no evidence of acute intracranial process, Cleen evidence of intracranial strain and subacute infarct.  CTA head and neck showed no evidence of large vessel lesion.  EDP discussed patient's case with on-call neurology, Dr. Selina Cooley, who recommended [facility] admission for further TIA/stroke evaluation, including MRI brain, and conveyed that neurology will consult, with additional recommendations pending at this time.    While in the ED, the following were administered: NS x 1 L bolus.  Subsequently, the patient was admitted for further TIA/stroke workup, with presentation also notable for initial hypotension, which is resolved with IV fluids, as well as the presence of SIRS criteria in the absence of underlying infectious process.    Review of Systems: As per HPI otherwise 10 point review of systems negative.   Past Medical History:  Diagnosis Date   Adjustment insomnia    Asthma    BCC (basal cell carcinoma), face    Bowel obstruction (HCC) 2017   Depression    Diverticulitis    Enlarged prostate    GERD (gastroesophageal reflux disease)     Past Surgical History:  Procedure Laterality Date   ABDOMINAL SURGERY     CATARACT EXTRACTION, BILATERAL     LAPAROTOMY N/A 01/22/2018   Procedure: EXPLORATORY LAPAROTOMY LYSIS OF ADHESIONS;  Surgeon: Romie Levee, MD;  Location: [facility] ORS;  Service: General;  Laterality: N/A;   SHOULDER SURGERY      Social History:  reports that he has never smoked. He uses smokeless tobacco. He reports current alcohol use of about 3.0 standard drinks of alcohol per week. He reports that he does not use drugs.   Allergies  Allergen Reactions   Nsaids Anaphylaxis    Family History  Problem Relation Age of Onset   Diabetes Other    Hypertension Other    CAD Other     Family history reviewed and not pertinent    Prior to Admission medications   Medication Sig Start Date End Date  Taking? Authorizing Provider  ARIPiprazole (ABILIFY) 10 MG tablet Take 1 tablet by mouth daily. 05/16/23  Yes [provider]  cetirizine (ZYRTEC) 10 MG tablet Take 10 mg by mouth daily.   Yes [provider]  cyanocobalamin 1000 MCG tablet Take 1,000 mcg by mouth in the morning and at bedtime.   Yes [provider]  RABEprazole (ACIPHEX) 20 MG tablet Take 20 mg by mouth daily. 05/31/22  Yes [provider]  tadalafil (CIALIS) 20 MG tablet Take 20 mg by mouth daily as needed for erectile dysfunction.   Yes [provider]  tamsulosin (FLOMAX) 0.4 MG CAPS capsule TAKE 1 CAPSULE BEFORE BED Patient taking differently: Take 0.4 mg by mouth at bedtime. 10/20/19  Yes Cox, Kirsten, MD  traZODone (DESYREL) 100 MG tablet TAKE 1 TABLET AT BEDTIME Patient taking differently: Take 100 mg by mouth at bedtime. 04/15/20  Yes CoxFritzi Mandes, MD     Objective    Physical Exam: Vitals:   07/10/23 1900 07/10/23 1915 07/10/23 1930 07/10/23 1943  BP: 111/74 107/89 111/71 111/72  Pulse: 62 (!) 58 67 63  Resp: 18 18 15 19   Temp:    98.3 F (36.8 C)  TempSrc:  Oral  SpO2: 100% 100% 100% 99%  Weight:      Height:        General: appears to be stated age; alert, oriented Skin: warm, dry, no rash Head:  AT/[place] Mouth:  Oral mucosa membranes appear dry, normal dentition Neck: supple; trachea midline Heart:  RRR; did not appreciate any M/R/G Lungs: CTAB, did not appreciate any wheezes, rales, or rhonchi Abdomen: + BS; soft, ND, NT Vascular: 2+ pedal pulses b/l; 2+ radial pulses b/l Extremities: no peripheral edema, no muscle wasting Neuro: strength and sensation intact in upper and lower extremities b/l; no evidence suggestive of slurred speech, dysarthria, or facial droop; Normal muscle tone. No tremors.     Labs on Admission: I have personally reviewed following labs and imaging studies  CBC: Recent Labs  Lab 07/10/23 1606  WBC 12.1*  NEUTROABS 9.1*   HGB 14.7  HCT 42.6  MCV 95.7  PLT 248   Basic Metabolic Panel: Recent Labs  Lab 07/10/23 1606  NA 136  K 3.6  CL 102  CO2 25  GLUCOSE 96  BUN 12  CREATININE 1.09  CALCIUM 9.3   GFR: Estimated Creatinine Clearance: 83.9 mL/min (by C-G formula based on SCr of 1.09 mg/dL). Liver Function Tests: Recent Labs  Lab 07/10/23 1606  AST 16  ALT 21  ALKPHOS 50  BILITOT 0.7  PROT 6.8  ALBUMIN 4.1   No results for input(s): "LIPASE", "AMYLASE" in the last 168 hours. No results for input(s): "AMMONIA" in the last 168 hours. Coagulation Profile: No results for input(s): "INR", "PROTIME" in the last 168 hours. Cardiac Enzymes: No results for input(s): "CKTOTAL", "CKMB", "CKMBINDEX", "TROPONINI" in the last 168 hours. BNP (last 3 results) No results for input(s): "PROBNP" in the last 8760 hours. HbA1C: No results for input(s): "HGBA1C" in the last 72 hours. CBG: Recent Labs  Lab 07/10/23 1607  GLUCAP 91   Lipid Profile: No results for input(s): "CHOL", "HDL", "LDLCALC", "TRIG", "CHOLHDL", "LDLDIRECT" in the last 72 hours. Thyroid Function Tests: No results for input(s): "TSH", "T4TOTAL", "FREET4", "T3FREE", "THYROIDAB" in the last 72 hours. Anemia Panel: No results for input(s): "VITAMINB12", "FOLATE", "FERRITIN", "TIBC", "IRON", "RETICCTPCT" in the last 72 hours. Urine analysis:    Component Value Date/Time   COLORURINE YELLOW 04/20/2023 1742   APPEARANCEUR CLEAR 04/20/2023 1742   LABSPEC >=1.030 04/20/2023 1742   PHURINE 6.0 04/20/2023 1742   GLUCOSEU NEGATIVE 04/20/2023 1742   GLUCOSEU NEGATIVE 10/19/2006 0801   HGBUR NEGATIVE 04/20/2023 1742   BILIRUBINUR SMALL (A) 04/20/2023 1742   KETONESUR NEGATIVE 04/20/2023 1742   PROTEINUR 100 (A) 04/20/2023 1742   UROBILINOGEN 0.2 mg/dL 09/12/3233 5732   NITRITE NEGATIVE 04/20/2023 1742   LEUKOCYTESUR NEGATIVE 04/20/2023 1742    Radiological Exams on Admission: CT ANGIO HEAD NECK W WO CM  Result Date:  07/10/2023 CLINICAL DATA:  Cerebral aneurysm screening, high risk; near syncope EXAM: CT ANGIOGRAPHY HEAD AND NECK WITH AND WITHOUT CONTRAST TECHNIQUE: Multidetector CT imaging of the head and neck was performed using the standard protocol during bolus administration of intravenous contrast. Multiplanar CT image reconstructions and MIPs were obtained to evaluate the vascular anatomy. Carotid stenosis measurements (when applicable) are obtained utilizing NASCET criteria, using the distal internal carotid diameter as the denominator. RADIATION DOSE REDUCTION: This exam was performed according to the departmental dose-optimization program which includes automated exposure control, adjustment of the mA and/or kV according to patient size and/or use of iterative reconstruction technique. CONTRAST:  75mL OMNIPAQUE IOHEXOL 350 MG/ML  SOLN COMPARISON:  07/10/2023 CT head, no prior CTA FINDINGS: CT HEAD FINDINGS For noncontrast findings, please see same day CT head. CTA NECK FINDINGS Aortic arch: Standard branching. Imaged portion shows no evidence of aneurysm or dissection. No significant stenosis of the major arch vessel origins. Right carotid system: No evidence of dissection, occlusion, or hemodynamically significant stenosis (greater than 50%). Left carotid system: No evidence of dissection, occlusion, or hemodynamically significant stenosis (greater than 50%). Vertebral arteries: No evidence of dissection, occlusion, or hemodynamically significant stenosis (greater than 50%). Skeleton: No acute osseous abnormality. Degenerative changes in the cervical spine. Other neck: No acute finding. Upper chest: Bronchial wall thickening. A few ground-glass opacities are noted in the right upper lobe (series 7, image 346 and 359). Emphysema. No pleural effusion. Review of the MIP images confirms the above findings CTA HEAD FINDINGS Anterior circulation: Both internal carotid arteries are patent to the termini, without significant  stenosis. A1 segments patent. Normal anterior communicating artery. Anterior cerebral arteries are patent to their distal aspects without significant stenosis. No M1 stenosis or occlusion. MCA branches perfused to their distal aspects without significant stenosis. Posterior circulation: Vertebral arteries patent to the vertebrobasilar junction without significant stenosis. Posterior inferior cerebellar arteries patent proximally. Basilar patent to its distal aspect without significant stenosis. Superior cerebellar arteries patent proximally. Patent P1 segments. PCAs perfused to their distal aspects without significant stenosis. The bilateral posterior communicating arteries are not visualized. Venous sinuses: As permitted by contrast timing, patent. Anatomic variants: None significant. No evidence of aneurysm or vascular malformation. Review of the MIP images confirms the above findings IMPRESSION: 1. No intracranial large vessel occlusion or significant stenosis. 2. No hemodynamically significant stenosis in the neck. 3. Bronchial wall thickening with a few ground-glass opacities in the right upper lobe, which could be infectious or inflammatory. Electronically Signed   By: Wiliam Ke M.D.   On: 07/10/2023 19:28   DG Chest Port 1 View  Result Date: 07/10/2023 CLINICAL DATA:  Blurred vision, difficulty forming words, dizziness EXAM: PORTABLE CHEST 1 VIEW COMPARISON:  05/17/2023 FINDINGS: Stable cardiomediastinal silhouette. Left basilar atelectasis/scarring. Otherwise no focal consolidation, pleural effusion, or pneumothorax. No displaced rib fractures. IMPRESSION: No acute cardiopulmonary disease. Electronically Signed   By: Minerva Fester M.D.   On: 07/10/2023 18:53   CT Head Wo Contrast  Result Date: 07/10/2023 CLINICAL DATA:  Syncope, altered mental status EXAM: CT HEAD WITHOUT CONTRAST TECHNIQUE: Contiguous axial images were obtained from the base of the skull through the vertex without intravenous  contrast. RADIATION DOSE REDUCTION: This exam was performed according to the departmental dose-optimization program which includes automated exposure control, adjustment of the mA and/or kV according to patient size and/or use of iterative reconstruction technique. COMPARISON:  None Available. FINDINGS: Brain: There is no acute intracranial hemorrhage, extra-axial fluid collection, or acute infarct. Parenchymal volume is normal. The ventricles are normal in size. Gray-white differentiation is preserved. The pituitary and suprasellar region are normal. There is no mass lesion. There is no mass effect or midline shift. Vascular: No hyperdense vessel or unexpected calcification. Skull: Normal. Negative for fracture or focal lesion. Sinuses/Orbits: The paranasal sinuses are clear. Bilateral lens implants are in place. The globes and orbits are otherwise unremarkable. Other: The mastoid air cells and middle ear cavities are clear. IMPRESSION: No acute intracranial pathology. Electronically Signed   By: Lesia Hausen M.D.   On: 07/10/2023 17:31      Assessment/Plan   Principal Problem:   TIA (transient ischemic attack) Active  Problems:   GERD (gastroesophageal reflux disease)   Expressive aphasia   Hypotension   SIRS (systemic inflammatory response syndrome) (HCC)   Leukocytosis   Depression   Allergic rhinitis   BPH (benign prostatic hyperplasia)    #) TIA: Suspicion for TIA vs acute ischemic cva on the basis of the following description: Sudden/acute onset of left-sided blurry vision involving both eyes, associated with expressive aphasia, dysarthria, lightheadedness starting at 1500 on 07/10/2023, and spontaneously resolving within a few minutes, without any residual acute focal neurologic deficits, will CTh is no evidence of acute process. Additionally, CTA head and neck with IV contrast showed no evidence of large vessel occlusion, high grade stenosis, dissection, aneurysm, or thrombus.     Additional considerations include amaurosis fugax as a consequence of systemic hypotension.  However, it is noted that the patient's symptoms completely resolved in spite of persistent hypotension.   EDP discussed patient's case and imaging with the on-call neurologist, Dr.  Selina Cooley, who recommended admission to the hospitalist service for further evaluation/management of concern for TIA versus acute ischemic CVA, including pursuit of MRI brain, as well as further assessment of potential modifiable ischemic CVA risk factors.  Neurology to formally consult, with additional recommendations to follow.   The patient with out any overt modifiable CV risk factors identified at this time, we will perform further assessment of such as detailed below.  Not a candidate for TPA administration given that the complete spontaneous resolution of his reported deficits. Current outpatient antiplatelet/anticoagulant regimen: None, while noting that he has a history of anaphylactic response to NSAIDs.  As result, he did not receive aspirin in the emergency department today.  For now will allow for permissive hypertension for 48 hours following onset of acute focal neurologic deficits, with associated parameters further quantified below, and with period of observance of permissive hypertension to end at  1500 on 07/12/23.  Of note, patient conveys that he requires general anesthesia with MRI.    Plan: Nursing bedside swallow evaluation x 1 now, and will not initiate oral medications or diet until the patient has passed this. Head of the bed at 30 degrees. Neuro checks per protocol. VS per protocol. Will allow for permissive hypertension for 48 hours during which will hold home Flomax with prn IV hydralazine ordered for SBP greater than 220 mmHg or DBP greater than 110 mmHg until  1500 on 07/12/2023. Monitor on telemetry, including monitoring for atrial fibrillation as modifiable risk factor for acute ischemic CVA.   MRI  brain tomorrow AM, with availability of general anesthesia, as further detailed above TTE with bubble study has been ordered for the morning. Additionally, as component of evaluation of potential modifiable ischemic CVA risk factors, will also check lipid panel and A1c. PT/OT/ST consults have been ordered for the morning.  Neurology to formally consult, as above, and will follow for their recommendations.  Check UDS.                #) Hypotension: Now resolved ; EMS noted soft initial blood pressures, with initial blood pressure at [facility] emergency department noted to be 83/58, improving with 1 liter of IV fluids, now normotensive.  He is not on any antihypertensive medications as an outpatient, but is noted to be on Flomax in the setting of BPH.  Differential includes potential hypovolemic contribution given improvement in blood pressure with IV fluids.  No overt evidence of active blood loss, and hemoglobin was found to be 14.7.  Neurogenic hypotension in  the setting of potential TIA versus stroke is within the differential, noting that the patient's neurologic deficits resolved while his blood pressure remained soft.  ACS appears less likely at this time in the absence of any chest pain, while troponin x 2 are nonelevated and flat, will EKG shows no evidence of acute ischemic changes.  Obstructive sources, including acute pulmonary embolism.  Clinically less likely at this time.  He does have positive SIRS criteria, as further detailed below, but no evidence of underlying infectious process at this time.  Will further evaluate for underlying infection, as outlined below, although septic contribution appears less likely at this time.  Plan: Monitor strict I's and O's and daily weights.  Monitor on telemetry.  Hold home Flomax for now.  Normal saline at 100 cc/h x 10 hours check urinalysis, procalcitonin level.  Repeat CMP, CBC in the morning.  Check urinary drug screen.  Follow-up  result of echocardiogram, as above.  Serial neurochecks, as above.               #) SIRS criteria present: Presentation associated with mild leukocytosis, with no evidence of Peja Allender 12,100, as well as tachycardia.  However, in the absence of e/o underlying infection at this time, including, chest x-ray that showed no evidence of acute cardiopulmonary process, including evidence of infiltrate, criteria for sepsis not currently met.  CTA neck showed some evidence of bronchospasm is COVID in the absence of any associated acute respiratory symptoms.  Will add on procalcitonin level to further assess.  Suspect non-infectious factors contributing to these SIRS findings, including potential reactive contribution towards leukocytosis in the setting of initial hypotension versus hemoconcentration element given clinical evidence of dehydration, particular given improvement in blood pressure with IV fluids. patient appears hemodynamically stable at this time and normotensive following initial IV fluids.  Consequently, will refrain from initiation of IV antibiotics at this time.   Plan: Repeat CBC with diff in the AM.  Monitor strict I's and O's and daily weights.  Monitor on telemetry. Refraining from IV abx for now, as above.  Check urinalysis, procalcitonin level.  Gentle IV fluids, as above.                    #) Depression: documented h/o such. On Abilify as outpatient.    Plan: Continue outpatient Abilify.             #) liver lesion: Per the patient, he has a history of hepatic lesions, which outpatient providers have conveyed may represent metastatic disease.  The patient conveys that his outpatient providers have been trying to pursue an MRI of the abdomen with and without contrast to further evaluate these lesions.  Given the patient's significant claustrophobia, exacerbated in scenario such as MRI, the patient is inquiring about the possibility of pursuing the  intended outpatient MRI of the abdomen with and without contrast in tandem with the plan for MRI brain during this hospitalization.  Plan: MRI abdomen with and without contrast during this hospitalization has been ordered.               #) GERD: documented h/o such; on  PPI as outpatient.   Plan: continue home PPI.                   #) Allergic Rhinitis: documented h/o such, on cetirizine , in the absence of any intranasal corticosteroid , as outpatient.    Plan: cont home cetirizine.                     #)  Benign Prostatic Hyperplasia:  documented h/o such; on tamsulosin as outpatient.   Plan: monitor strict I's & O's and daily weights. Repeat CMP in AM.  Hold home tamsulosin for now in the setting of initial hypotension.     DVT prophylaxis: SCD's   Code Status: Full code Family Communication: none Disposition Plan: Per Rounding Team Consults called: EDP d/w on neurology, Dr. Selina Cooley, who will consult, as further detailed above;  Admission status: Observation     I SPENT GREATER THAN 75  MINUTES IN CLINICAL CARE TIME/MEDICAL DECISION-MAKING IN COMPLETING THIS ADMISSION.      Chaney Born Teylor Wolven DO [facility]  From 7PM - 7AM   07/10/2023, 8:30 PM

## 2023-07-10 NOTE — ED Notes (Signed)
ED TO INPATIENT HANDOFF REPORT  ED Nurse Name and Phone #: Jesse Sans, [phone]  S Name/Age/Gender Edward Hoover. 62 y.o. male Room/Bed: 006C/006C  Code Status   Code Status: Full Code  Home/SNF/Other Home Patient oriented to: self, place, time, and situation Is this baseline? Yes   Triage Complete: Triage complete  Chief Complaint TIA (transient ischemic attack) [G45.9]  Triage Note Blurred vision, difficulty forming words, dizziness and had a near syncopal episode while at work. Blurred vision resolved. Alert and oriented x 4.   BP 104/68 RR 16 98% on RA HR 60   Allergies Allergies  Allergen Reactions   Nsaids Anaphylaxis    Level of Care/Admitting Diagnosis ED Disposition     ED Disposition  Admit   Condition  --   Comment  Hospital Area: [facility] [[id number]]  Level of Care: Telemetry Medical [104]  May place patient in observation at [facility] or [facility] if equivalent level of care is available:: No  Covid Evaluation: Asymptomatic - no recent exposure (last 10 days) testing not required  Diagnosis: TIA (transient ischemic attack) [324401]  Admitting Physician: Angie Fava [[id number]]  Attending Physician: Angie Fava [[id number]]          B Medical/Surgery History Past Medical History:  Diagnosis Date   Adjustment insomnia    Asthma    BCC (basal cell carcinoma), face    Bowel obstruction (HCC) 2017   Depression    Diverticulitis    Enlarged prostate    GERD (gastroesophageal reflux disease)    Past Surgical History:  Procedure Laterality Date   ABDOMINAL SURGERY     CATARACT EXTRACTION, BILATERAL     LAPAROTOMY N/A 01/22/2018   Procedure: EXPLORATORY LAPAROTOMY LYSIS OF ADHESIONS;  Surgeon: Romie Levee, MD;  Location: [facility];  Service: General;  Laterality: N/A;   SHOULDER SURGERY       A IV Location/Drains/Wounds Patient Lines/Drains/Airways Status     Active Line/Drains/Airways      Name Placement date Placement time Site Days   Peripheral IV 07/10/23 20 G Left Antecubital 07/10/23  1610  Antecubital  less than 1            Intake/Output Last 24 hours No intake or output data in the 24 hours ending 07/10/23 2201  Labs/Imaging Results for orders placed or performed during the hospital encounter of 07/10/23 (from the past 48 hour(s))  Comprehensive metabolic panel     Status: None   Collection Time: 07/10/23  4:06 PM  Result Value Ref Range   Sodium 136 135 - 145 mmol/L   Potassium 3.6 3.5 - 5.1 mmol/L   Chloride 102 98 - 111 mmol/L   CO2 25 22 - 32 mmol/L   Glucose, Bld 96 70 - 99 mg/dL    Comment: Glucose reference range applies only to samples taken after fasting for at least 8 hours.   BUN 12 8 - 23 mg/dL   Creatinine, Ser 4.25 0.61 - 1.24 mg/dL   Calcium 9.3 8.9 - 95.6 mg/dL   Total Protein 6.8 6.5 - 8.1 g/dL   Albumin 4.1 3.5 - 5.0 g/dL   AST 16 15 - 41 U/L   ALT 21 0 - 44 U/L   Alkaline Phosphatase 50 38 - 126 U/L   Total Bilirubin 0.7 <1.2 mg/dL   GFR, Estimated >38 >75 mL/min    Comment: (NOTE) Calculated using the CKD-EPI Creatinine Equation (2021)    Anion gap 9 5 -  15    Comment: Performed at [facility], [address] [place], [place] [address]  CBC WITH DIFFERENTIAL     Status: Abnormal   Collection Time: 07/10/23  4:06 PM  Result Value Ref Range   WBC 12.1 (H) 4.0 - 10.5 K/uL   RBC 4.45 4.22 - 5.81 MIL/uL   Hemoglobin 14.7 13.0 - 17.0 g/dL   HCT 60.4 54.0 - 98.1 %   MCV 95.7 80.0 - 100.0 fL   MCH 33.0 26.0 - 34.0 pg   MCHC 34.5 30.0 - 36.0 g/dL   RDW 19.1 47.8 - 29.5 %   Platelets 248 150 - 400 K/uL   nRBC 0.0 0.0 - 0.2 %   Neutrophils Relative % 76 %   Neutro Abs 9.1 (H) 1.7 - 7.7 K/uL   Lymphocytes Relative 19 %   Lymphs Abs 2.3 0.7 - 4.0 K/uL   Monocytes Relative 5 %   Monocytes Absolute 0.6 0.1 - 1.0 K/uL   Eosinophils Relative 0 %   Eosinophils Absolute 0.0 0.0 - 0.5 K/uL   Basophils Relative 0 %   Basophils  Absolute 0.0 0.0 - 0.1 K/uL   Immature Granulocytes 0 %   Abs Immature Granulocytes 0.05 0.00 - 0.07 K/uL    Comment: Performed at [facility], [address], [place], [place] [address]  Hemoglobin A1c     Status: None   Collection Time: 07/10/23  4:06 PM  Result Value Ref Range   Hgb A1c MFr Bld 5.0 4.8 - 5.6 %    Comment: (NOTE) Pre diabetes:          5.7%-6.4%  Diabetes:              >6.4%  Glycemic control for   <7.0% adults with diabetes    Mean Plasma Glucose 96.8 mg/dL    Comment: Performed at [facility], [address] [place], [place] [address]  CBG monitoring, ED     Status: None   Collection Time: 07/10/23  4:07 PM  Result Value Ref Range   Glucose-Capillary 91 70 - 99 mg/dL    Comment: Glucose reference range applies only to samples taken after fasting for at least 8 hours.  Troponin I (High Sensitivity)     Status: None   Collection Time: 07/10/23  4:54 PM  Result Value Ref Range   Troponin I (High Sensitivity) 5 <18 ng/L    Comment: (NOTE) Elevated high sensitivity troponin I (hsTnI) values and significant  changes across serial measurements may suggest ACS but many other  chronic and acute conditions are known to elevate hsTnI results.  Refer to the "Links" section for chest pain algorithms and additional  guidance. Performed at [facility], [address] [place], [place] [address]   I-Stat CG4 Lactic Acid     Status: None   Collection Time: 07/10/23  5:22 PM  Result Value Ref Range   Lactic Acid, Venous 1.3 0.5 - 1.9 mmol/L  Troponin I (High Sensitivity)     Status: None   Collection Time: 07/10/23  6:29 PM  Result Value Ref Range   Troponin I (High Sensitivity) 5 <18 ng/L    Comment: (NOTE) Elevated high sensitivity troponin I (hsTnI) values and significant  changes across serial measurements may suggest ACS but many other  chronic and acute conditions are known to elevate hsTnI results.  Refer to the "Links" section for chest  pain algorithms and additional  guidance. Performed  at [facility], [address], [place], [place] [address]   Magnesium     Status: None   Collection Time: 07/10/23  6:29 PM  Result Value Ref Range   Magnesium 1.7 1.7 - 2.4 mg/dL    Comment: Performed at [facility], [address], [place], [place] [address]  I-Stat CG4 Lactic Acid     Status: None   Collection Time: 07/10/23  6:37 PM  Result Value Ref Range   Lactic Acid, Venous 1.7 0.5 - 1.9 mmol/L  Urinalysis, Complete w Microscopic -Urine, Clean Catch     Status: Abnormal   Collection Time: 07/10/23  9:00 PM  Result Value Ref Range   Color, Urine STRAW (A) YELLOW   APPearance CLEAR CLEAR   Specific Gravity, Urine 1.009 1.005 - 1.030   pH 7.0 5.0 - 8.0   Glucose, UA NEGATIVE NEGATIVE mg/dL   Hgb urine dipstick NEGATIVE NEGATIVE   Bilirubin Urine NEGATIVE NEGATIVE   Ketones, ur NEGATIVE NEGATIVE mg/dL   Protein, ur NEGATIVE NEGATIVE mg/dL   Nitrite NEGATIVE NEGATIVE   Leukocytes,Ua NEGATIVE NEGATIVE   RBC / HPF 0-5 0 - 5 RBC/hpf   WBC, UA 0-5 0 - 5 WBC/hpf   Bacteria, UA NONE SEEN NONE SEEN   Squamous Epithelial / HPF 0-5 0 - 5 /HPF   Mucus PRESENT     Comment: Performed at [facility], [address] [place], [place] [address]  Rapid urine drug screen (hospital performed)     Status: Abnormal   Collection Time: 07/10/23  9:00 PM  Result Value Ref Range   Opiates NONE DETECTED NONE DETECTED   Cocaine NONE DETECTED NONE DETECTED   Benzodiazepines NONE DETECTED NONE DETECTED   Amphetamines NONE DETECTED NONE DETECTED   Tetrahydrocannabinol POSITIVE (A) NONE DETECTED   Barbiturates NONE DETECTED NONE DETECTED    Comment: (NOTE) DRUG SCREEN FOR MEDICAL PURPOSES ONLY.  IF CONFIRMATION IS NEEDED FOR ANY PURPOSE, NOTIFY LAB WITHIN 5 DAYS.  LOWEST DETECTABLE LIMITS FOR URINE DRUG SCREEN Drug Class                     Cutoff (ng/mL) Amphetamine and metabolites    1000 Barbiturate and metabolites     200 Benzodiazepine                 200 Opiates and metabolites        300 Cocaine and metabolites        300 THC                            50 Performed at [facility], [address] [place], [place] [address]    CT ANGIO HEAD NECK W WO CM  Result Date: 07/10/2023 CLINICAL DATA:  Cerebral aneurysm screening, high risk; near syncope EXAM: CT ANGIOGRAPHY HEAD AND NECK WITH AND WITHOUT CONTRAST TECHNIQUE: Multidetector CT imaging of the head and neck was performed using the standard protocol during bolus administration of intravenous contrast. Multiplanar CT image reconstructions and MIPs were obtained to evaluate the vascular anatomy. Carotid stenosis measurements (when applicable) are obtained utilizing NASCET criteria, using the distal internal carotid diameter as the denominator. RADIATION DOSE REDUCTION: This exam was performed according to the departmental dose-optimization program which includes automated exposure control, adjustment of the mA and/or kV according to patient size and/or use of iterative reconstruction technique. CONTRAST:  75mL OMNIPAQUE IOHEXOL 350  MG/ML SOLN COMPARISON:  07/10/2023 CT head, no prior CTA FINDINGS: CT HEAD FINDINGS For noncontrast findings, please see same day CT head. CTA NECK FINDINGS Aortic arch: Standard branching. Imaged portion shows no evidence of aneurysm or dissection. No significant stenosis of the major arch vessel origins. Right carotid system: No evidence of dissection, occlusion, or hemodynamically significant stenosis (greater than 50%). Left carotid system: No evidence of dissection, occlusion, or hemodynamically significant stenosis (greater than 50%). Vertebral arteries: No evidence of dissection, occlusion, or hemodynamically significant stenosis (greater than 50%). Skeleton: No acute osseous abnormality. Degenerative changes in the cervical spine. Other neck: No acute finding. Upper chest: Bronchial wall thickening. A few ground-glass  opacities are noted in the right upper lobe (series 7, image 346 and 359). Emphysema. No pleural effusion. Review of the MIP images confirms the above findings CTA HEAD FINDINGS Anterior circulation: Both internal carotid arteries are patent to the termini, without significant stenosis. A1 segments patent. Normal anterior communicating artery. Anterior cerebral arteries are patent to their distal aspects without significant stenosis. No M1 stenosis or occlusion. MCA branches perfused to their distal aspects without significant stenosis. Posterior circulation: Vertebral arteries patent to the vertebrobasilar junction without significant stenosis. Posterior inferior cerebellar arteries patent proximally. Basilar patent to its distal aspect without significant stenosis. Superior cerebellar arteries patent proximally. Patent P1 segments. PCAs perfused to their distal aspects without significant stenosis. The bilateral posterior communicating arteries are not visualized. Venous sinuses: As permitted by contrast timing, patent. Anatomic variants: None significant. No evidence of aneurysm or vascular malformation. Review of the MIP images confirms the above findings IMPRESSION: 1. No intracranial large vessel occlusion or significant stenosis. 2. No hemodynamically significant stenosis in the neck. 3. Bronchial wall thickening with a few ground-glass opacities in the right upper lobe, which could be infectious or inflammatory. Electronically Signed   By: Wiliam Ke M.D.   On: 07/10/2023 19:28   DG Chest Port 1 View  Result Date: 07/10/2023 CLINICAL DATA:  Blurred vision, difficulty forming words, dizziness EXAM: PORTABLE CHEST 1 VIEW COMPARISON:  05/17/2023 FINDINGS: Stable cardiomediastinal silhouette. Left basilar atelectasis/scarring. Otherwise no focal consolidation, pleural effusion, or pneumothorax. No displaced rib fractures. IMPRESSION: No acute cardiopulmonary disease. Electronically Signed   By: Minerva Fester M.D.   On: 07/10/2023 18:53   CT Head Wo Contrast  Result Date: 07/10/2023 CLINICAL DATA:  Syncope, altered mental status EXAM: CT HEAD WITHOUT CONTRAST TECHNIQUE: Contiguous axial images were obtained from the base of the skull through the vertex without intravenous contrast. RADIATION DOSE REDUCTION: This exam was performed according to the departmental dose-optimization program which includes automated exposure control, adjustment of the mA and/or kV according to patient size and/or use of iterative reconstruction technique. COMPARISON:  None Available. FINDINGS: Brain: There is no acute intracranial hemorrhage, extra-axial fluid collection, or acute infarct. Parenchymal volume is normal. The ventricles are normal in size. Gray-white differentiation is preserved. The pituitary and suprasellar region are normal. There is no mass lesion. There is no mass effect or midline shift. Vascular: No hyperdense vessel or unexpected calcification. Skull: Normal. Negative for fracture or focal lesion. Sinuses/Orbits: The paranasal sinuses are clear. Bilateral lens implants are in place. The globes and orbits are otherwise unremarkable. Other: The mastoid air cells and middle ear cavities are clear. IMPRESSION: No acute intracranial pathology. Electronically Signed   By: Lesia Hausen M.D.   On: 07/10/2023 17:31    Pending Labs [employer] (From admission, onward)     Start  Ordered   07/11/23 0500  CBC with Differential/Platelet  Tomorrow morning,   R        07/10/23 2007   07/11/23 0500  Comprehensive metabolic panel  Tomorrow morning,   R        07/10/23 2007   07/11/23 0500  Magnesium  Tomorrow morning,   R        07/10/23 2007   07/11/23 0500  Lipid panel  (Labs)  Tomorrow morning,   R        07/10/23 2010   07/10/23 2028  Procalcitonin  Add-on,   AD       References:    Procalcitonin Lower Respiratory Tract Infection AND Sepsis Procalcitonin Algorithm   07/10/23 2027             Vitals/Pain Today's Vitals   07/10/23 1915 07/10/23 1930 07/10/23 1943 07/10/23 2145  BP: 107/89 111/71 111/72 113/80  Pulse: (!) 58 67 63 65  Resp: 18 15 19 15   Temp:   98.3 F (36.8 C)   TempSrc:   Oral   SpO2: 100% 100% 99% 100%  Weight:      Height:      PainSc:        Isolation Precautions No active isolations  Medications Medications  acetaminophen (TYLENOL) tablet 650 mg (has no administration in time range)    Or  acetaminophen (TYLENOL) suppository 650 mg (has no administration in time range)  melatonin tablet 3 mg (has no administration in time range)  ondansetron (ZOFRAN) injection 4 mg (has no administration in time range)  hydrALAZINE (APRESOLINE) injection 10 mg (has no administration in time range)  0.9 %  sodium chloride infusion ( Intravenous New Bag/Given 07/10/23 2058)  ARIPiprazole (ABILIFY) tablet 10 mg (has no administration in time range)  loratadine (CLARITIN) tablet 10 mg (has no administration in time range)  pantoprazole (PROTONIX) EC tablet 40 mg (has no administration in time range)  LORazepam (ATIVAN) injection 2 mg (has no administration in time range)  sodium chloride 0.9 % bolus 1,000 mL (0 mLs Intravenous Stopped 07/10/23 1755)  iohexol (OMNIPAQUE) 350 MG/ML injection 75 mL (75 mLs Intravenous Contrast Given 07/10/23 1821)   stroke: early stages of recovery book ( Does not apply Given 07/10/23 2059)    Mobility walks     Focused Assessments Neuro Assessment Handoff:  Swallow screen pass? Yes    NIH Stroke Scale  Dizziness Present: No Headache Present: No Level of Consciousness (1a.)   : Alert, keenly responsive LOC Questions (1b. )   : Answers both questions correctly LOC Commands (1c. )   : Performs both tasks correctly Best Gaze (2. )  : Normal Visual (3. )  : No visual loss Facial Palsy (4. )    : Normal symmetrical movements Motor Arm, Left (5a. )   : No drift Motor Arm, Right (5b. ) : No drift Motor Leg, Left (6a. )  :  No drift Motor Leg, Right (6b. ) : No drift Limb Ataxia (7. ): Absent Sensory (8. )  : Normal, no sensory loss Best Language (9. )  : No aphasia Dysarthria (10. ): Normal Extinction/Inattention (11.)   : No Abnormality Complete NIHSS TOTAL: 0     Neuro Assessment:   Neuro Checks:      Has TPA been given? No If patient is a Neuro Trauma and patient is going to OR before floor call report to 4N Charge nurse: [phone] or [phone]   R Recommendations: See  Admitting Provider Note  Report given to:   Additional Notes: pt is AAOx4. Pt is on room air.

## 2023-07-10 NOTE — H&P (Addendum)
History and Physical      Edward Hoover. XBM:[id number] DOB: Dec 17, 1960 DOA: 07/10/2023; DOS: 07/10/2023  PCP: Verlee Rossetti, PA-C  Patient coming from: home   I have personally briefly reviewed patient's old medical records in [facility]  Chief Complaint: Left-sided blurry vision  HPI: Edward Hoover. is a 62 y.o. male with medical history significant for depression, BPH, who is admitted to [facility] on 07/10/2023 for further TIA/stroke workup after presenting from home to [facility] ED complaining of left-sided blurry vision.   The patient reports that he was at his baseline level of health, when he had sudden onset at 1500 today while at work of left-sided blurry vision.  He reports that the left-sided blurry vision involved by both eyes, noting that when he covered his right eye he had blurry vision in the lateral aspect of the left eye, will also noting when he covered his left eye he maintained blurry vision in the left portion of his visual field with using only his right eye.  He notes that this was associate with some lightheadedness as well as " difficulty getting words out" and some dysarthria.  He also noted some associated lightheadedness, but in the absence of subjective sensation of impending loss of consciousness nor any formal loss of consciousness or any fall/trauma.  Denies any associated acute focal weakness, paresthesias, numbness, dysphagia, vertigo, nausea, vomiting, facial droop, or headache.    The above symptoms persisted for a few minutes, before spontaneously resolving.   Denies any associated chest pain, shortness of breath, palpitations, diaphoresis.   He also denies any recent subjective fever, chills, rigors.  No recent abdominal discomfort, dysuria, neck stiffness, rash.  No recent cough or hemoptysis nor any recent calf tenderness or new lower extremity erythema.  Denies any recent new onset peripheral edema.  In terms of his  outpatient medications with antihypertensive ramifications, he is on Flomax in the setting of BPH.   Denies any previous history of stroke. Medical/social history notable for no known history of hypertension, hyperlipidemia, diabetes, paroxysmal atrial fibrillation or any known history of obstructive sleep apnea.  Conveys that he is a lifelong non-smoker.  Not currently on any blood thinners as an outpatient, including no aspirin, noting that he has a history of anaphylactic response to NSAIDs.  Not currently on any antilipid medications at home.  In the setting of the above symptoms, the patient contacted EMS, who reportedly noted the patient to have soft initial blood pressures, prompting an undisclosed volume of IV fluids administered via EMS and relative to his current emergency department.  However, it was noted that the patient's symptoms resolved even with persistence of his soft blood pressures.    ED Course:  Vital signs in the ED were notable for the following: Afebrile; rates in the 50s to 60s; initial blood pressure 83/58, [place] improving to 111/72 following interval 1 L bolus of NS; respiratory rate 16-22, oxygen saturation 99 to 100% on room air.  Labs were notable for the following: CMP notable for the following: Sodium 136, bicarbonate 25, creatinine 1.09 compared to most recent prior serum creatinine did point of 0.89 on 04/20/2023, glucose 96, liver enzymes within normal limits.  High-sensitivity troponin I x 2 values were both on V5.  CBC notable for white cell count 12,100 with 76% neutrophils, hemoglobin 14.7.  Lactic acid 1.3.  Per my interpretation, EKG in ED demonstrated the following: Sinus bradycardia with heart rate 54, normal levels,  no evidence of T wave or ST changes, including no evidence of ST elevation.  Imaging in the ED, per corresponding formal radiology read, was notable for the following: 1 view chest x-ray showed no evidence of acute cardiopulmonary process, Cleen  evidence of endotracheal edema, effusion, pneumothorax.  CT head showed no evidence of acute intracranial process, Cleen evidence of intracranial strain and subacute infarct.  CTA head and neck showed no evidence of large vessel lesion.  EDP discussed patient's case with on-call neurology, Dr. Selina Cooley, who recommended [facility] admission for further TIA/stroke evaluation, including MRI brain, and conveyed that neurology will consult, with additional recommendations pending at this time.    While in the ED, the following were administered: NS x 1 L bolus.  Subsequently, the patient was admitted for further TIA/stroke workup, with presentation also notable for initial hypotension, which is resolved with IV fluids, as well as the presence of SIRS criteria in the absence of underlying infectious process.    Review of Systems: As per HPI otherwise 10 point review of systems negative.   Past Medical History:  Diagnosis Date   Adjustment insomnia    Asthma    BCC (basal cell carcinoma), face    Bowel obstruction (HCC) 2017   Depression    Diverticulitis    Enlarged prostate    GERD (gastroesophageal reflux disease)     Past Surgical History:  Procedure Laterality Date   ABDOMINAL SURGERY     CATARACT EXTRACTION, BILATERAL     LAPAROTOMY N/A 01/22/2018   Procedure: EXPLORATORY LAPAROTOMY LYSIS OF ADHESIONS;  Surgeon: Romie Levee, MD;  Location: [facility] ORS;  Service: General;  Laterality: N/A;   SHOULDER SURGERY      Social History:  reports that he has never smoked. He uses smokeless tobacco. He reports current alcohol use of about 3.0 standard drinks of alcohol per week. He reports that he does not use drugs.   Allergies  Allergen Reactions   Nsaids Anaphylaxis    Family History  Problem Relation Age of Onset   Diabetes Other    Hypertension Other    CAD Other     Family history reviewed and not pertinent    Prior to Admission medications   Medication Sig Start Date End Date  Taking? Authorizing Provider  ARIPiprazole (ABILIFY) 10 MG tablet Take 1 tablet by mouth daily. 05/16/23  Yes [provider]  cetirizine (ZYRTEC) 10 MG tablet Take 10 mg by mouth daily.   Yes [provider]  cyanocobalamin 1000 MCG tablet Take 1,000 mcg by mouth in the morning and at bedtime.   Yes [provider]  RABEprazole (ACIPHEX) 20 MG tablet Take 20 mg by mouth daily. 05/31/22  Yes [provider]  tadalafil (CIALIS) 20 MG tablet Take 20 mg by mouth daily as needed for erectile dysfunction.   Yes [provider]  tamsulosin (FLOMAX) 0.4 MG CAPS capsule TAKE 1 CAPSULE BEFORE BED Patient taking differently: Take 0.4 mg by mouth at bedtime. 10/20/19  Yes Cox, Kirsten, MD  traZODone (DESYREL) 100 MG tablet TAKE 1 TABLET AT BEDTIME Patient taking differently: Take 100 mg by mouth at bedtime. 04/15/20  Yes CoxFritzi Mandes, MD     Objective    Physical Exam: Vitals:   07/10/23 1900 07/10/23 1915 07/10/23 1930 07/10/23 1943  BP: 111/74 107/89 111/71 111/72  Pulse: 62 (!) 58 67 63  Resp: 18 18 15 19   Temp:    98.3 F (36.8 C)  TempSrc:  Oral  SpO2: 100% 100% 100% 99%  Weight:      Height:        General: appears to be stated age; alert, oriented Skin: warm, dry, no rash Head:  AT/[place] Mouth:  Oral mucosa membranes appear dry, normal dentition Neck: supple; trachea midline Heart:  RRR; did not appreciate any M/R/G Lungs: CTAB, did not appreciate any wheezes, rales, or rhonchi Abdomen: + BS; soft, ND, NT Vascular: 2+ pedal pulses b/l; 2+ radial pulses b/l Extremities: no peripheral edema, no muscle wasting Neuro: strength and sensation intact in upper and lower extremities b/l; no evidence suggestive of slurred speech, dysarthria, or facial droop; Normal muscle tone. No tremors.     Labs on Admission: I have personally reviewed following labs and imaging studies  CBC: Recent Labs  Lab 07/10/23 1606  WBC 12.1*  NEUTROABS 9.1*   HGB 14.7  HCT 42.6  MCV 95.7  PLT 248   Basic Metabolic Panel: Recent Labs  Lab 07/10/23 1606  NA 136  K 3.6  CL 102  CO2 25  GLUCOSE 96  BUN 12  CREATININE 1.09  CALCIUM 9.3   GFR: Estimated Creatinine Clearance: 83.9 mL/min (by C-G formula based on SCr of 1.09 mg/dL). Liver Function Tests: Recent Labs  Lab 07/10/23 1606  AST 16  ALT 21  ALKPHOS 50  BILITOT 0.7  PROT 6.8  ALBUMIN 4.1   No results for input(s): "LIPASE", "AMYLASE" in the last 168 hours. No results for input(s): "AMMONIA" in the last 168 hours. Coagulation Profile: No results for input(s): "INR", "PROTIME" in the last 168 hours. Cardiac Enzymes: No results for input(s): "CKTOTAL", "CKMB", "CKMBINDEX", "TROPONINI" in the last 168 hours. BNP (last 3 results) No results for input(s): "PROBNP" in the last 8760 hours. HbA1C: No results for input(s): "HGBA1C" in the last 72 hours. CBG: Recent Labs  Lab 07/10/23 1607  GLUCAP 91   Lipid Profile: No results for input(s): "CHOL", "HDL", "LDLCALC", "TRIG", "CHOLHDL", "LDLDIRECT" in the last 72 hours. Thyroid Function Tests: No results for input(s): "TSH", "T4TOTAL", "FREET4", "T3FREE", "THYROIDAB" in the last 72 hours. Anemia Panel: No results for input(s): "VITAMINB12", "FOLATE", "FERRITIN", "TIBC", "IRON", "RETICCTPCT" in the last 72 hours. Urine analysis:    Component Value Date/Time   COLORURINE YELLOW 04/20/2023 1742   APPEARANCEUR CLEAR 04/20/2023 1742   LABSPEC >=1.030 04/20/2023 1742   PHURINE 6.0 04/20/2023 1742   GLUCOSEU NEGATIVE 04/20/2023 1742   GLUCOSEU NEGATIVE 10/19/2006 0801   HGBUR NEGATIVE 04/20/2023 1742   BILIRUBINUR SMALL (A) 04/20/2023 1742   KETONESUR NEGATIVE 04/20/2023 1742   PROTEINUR 100 (A) 04/20/2023 1742   UROBILINOGEN 0.2 mg/dL 09/12/3233 5732   NITRITE NEGATIVE 04/20/2023 1742   LEUKOCYTESUR NEGATIVE 04/20/2023 1742    Radiological Exams on Admission: CT ANGIO HEAD NECK W WO CM  Result Date:  07/10/2023 CLINICAL DATA:  Cerebral aneurysm screening, high risk; near syncope EXAM: CT ANGIOGRAPHY HEAD AND NECK WITH AND WITHOUT CONTRAST TECHNIQUE: Multidetector CT imaging of the head and neck was performed using the standard protocol during bolus administration of intravenous contrast. Multiplanar CT image reconstructions and MIPs were obtained to evaluate the vascular anatomy. Carotid stenosis measurements (when applicable) are obtained utilizing NASCET criteria, using the distal internal carotid diameter as the denominator. RADIATION DOSE REDUCTION: This exam was performed according to the departmental dose-optimization program which includes automated exposure control, adjustment of the mA and/or kV according to patient size and/or use of iterative reconstruction technique. CONTRAST:  75mL OMNIPAQUE IOHEXOL 350 MG/ML  SOLN COMPARISON:  07/10/2023 CT head, no prior CTA FINDINGS: CT HEAD FINDINGS For noncontrast findings, please see same day CT head. CTA NECK FINDINGS Aortic arch: Standard branching. Imaged portion shows no evidence of aneurysm or dissection. No significant stenosis of the major arch vessel origins. Right carotid system: No evidence of dissection, occlusion, or hemodynamically significant stenosis (greater than 50%). Left carotid system: No evidence of dissection, occlusion, or hemodynamically significant stenosis (greater than 50%). Vertebral arteries: No evidence of dissection, occlusion, or hemodynamically significant stenosis (greater than 50%). Skeleton: No acute osseous abnormality. Degenerative changes in the cervical spine. Other neck: No acute finding. Upper chest: Bronchial wall thickening. A few ground-glass opacities are noted in the right upper lobe (series 7, image 346 and 359). Emphysema. No pleural effusion. Review of the MIP images confirms the above findings CTA HEAD FINDINGS Anterior circulation: Both internal carotid arteries are patent to the termini, without significant  stenosis. A1 segments patent. Normal anterior communicating artery. Anterior cerebral arteries are patent to their distal aspects without significant stenosis. No M1 stenosis or occlusion. MCA branches perfused to their distal aspects without significant stenosis. Posterior circulation: Vertebral arteries patent to the vertebrobasilar junction without significant stenosis. Posterior inferior cerebellar arteries patent proximally. Basilar patent to its distal aspect without significant stenosis. Superior cerebellar arteries patent proximally. Patent P1 segments. PCAs perfused to their distal aspects without significant stenosis. The bilateral posterior communicating arteries are not visualized. Venous sinuses: As permitted by contrast timing, patent. Anatomic variants: None significant. No evidence of aneurysm or vascular malformation. Review of the MIP images confirms the above findings IMPRESSION: 1. No intracranial large vessel occlusion or significant stenosis. 2. No hemodynamically significant stenosis in the neck. 3. Bronchial wall thickening with a few ground-glass opacities in the right upper lobe, which could be infectious or inflammatory. Electronically Signed   By: Wiliam Ke M.D.   On: 07/10/2023 19:28   DG Chest Port 1 View  Result Date: 07/10/2023 CLINICAL DATA:  Blurred vision, difficulty forming words, dizziness EXAM: PORTABLE CHEST 1 VIEW COMPARISON:  05/17/2023 FINDINGS: Stable cardiomediastinal silhouette. Left basilar atelectasis/scarring. Otherwise no focal consolidation, pleural effusion, or pneumothorax. No displaced rib fractures. IMPRESSION: No acute cardiopulmonary disease. Electronically Signed   By: Minerva Fester M.D.   On: 07/10/2023 18:53   CT Head Wo Contrast  Result Date: 07/10/2023 CLINICAL DATA:  Syncope, altered mental status EXAM: CT HEAD WITHOUT CONTRAST TECHNIQUE: Contiguous axial images were obtained from the base of the skull through the vertex without intravenous  contrast. RADIATION DOSE REDUCTION: This exam was performed according to the departmental dose-optimization program which includes automated exposure control, adjustment of the mA and/or kV according to patient size and/or use of iterative reconstruction technique. COMPARISON:  None Available. FINDINGS: Brain: There is no acute intracranial hemorrhage, extra-axial fluid collection, or acute infarct. Parenchymal volume is normal. The ventricles are normal in size. Gray-white differentiation is preserved. The pituitary and suprasellar region are normal. There is no mass lesion. There is no mass effect or midline shift. Vascular: No hyperdense vessel or unexpected calcification. Skull: Normal. Negative for fracture or focal lesion. Sinuses/Orbits: The paranasal sinuses are clear. Bilateral lens implants are in place. The globes and orbits are otherwise unremarkable. Other: The mastoid air cells and middle ear cavities are clear. IMPRESSION: No acute intracranial pathology. Electronically Signed   By: Lesia Hausen M.D.   On: 07/10/2023 17:31      Assessment/Plan   Principal Problem:   TIA (transient ischemic attack) Active  Problems:   GERD (gastroesophageal reflux disease)   Expressive aphasia   Hypotension   SIRS (systemic inflammatory response syndrome) (HCC)   Leukocytosis   Depression   Allergic rhinitis   BPH (benign prostatic hyperplasia)    #) TIA: Suspicion for TIA vs acute ischemic cva on the basis of the following description: Sudden/acute onset of left-sided blurry vision involving both eyes, associated with expressive aphasia, dysarthria, lightheadedness starting at 1500 on 07/10/2023, and spontaneously resolving within a few minutes, without any residual acute focal neurologic deficits, will CTh is no evidence of acute process. Additionally, CTA head and neck with IV contrast showed no evidence of large vessel occlusion, high grade stenosis, dissection, aneurysm, or thrombus.     Additional considerations include amaurosis fugax as a consequence of systemic hypotension.  However, it is noted that the patient's symptoms completely resolved in spite of persistent hypotension.   EDP discussed patient's case and imaging with the on-call neurologist, Dr.  Selina Cooley, who recommended admission to the hospitalist service for further evaluation/management of concern for TIA versus acute ischemic CVA, including pursuit of MRI brain, as well as further assessment of potential modifiable ischemic CVA risk factors.  Neurology to formally consult, with additional recommendations to follow.   The patient with out any overt modifiable CV risk factors identified at this time, we will perform further assessment of such as detailed below.  Not a candidate for TPA administration given that the complete spontaneous resolution of his reported deficits. Current outpatient antiplatelet/anticoagulant regimen: None, while noting that he has a history of anaphylactic response to NSAIDs.  As result, he did not receive aspirin in the emergency department today.  For now will allow for permissive hypertension for 48 hours following onset of acute focal neurologic deficits, with associated parameters further quantified below, and with period of observance of permissive hypertension to end at  1500 on 07/12/23.  Of note, patient conveys that he requires general anesthesia with MRI.    Plan: Nursing bedside swallow evaluation x 1 now, and will not initiate oral medications or diet until the patient has passed this. Head of the bed at 30 degrees. Neuro checks per protocol. VS per protocol. Will allow for permissive hypertension for 48 hours during which will hold home Flomax with prn IV hydralazine ordered for SBP greater than 220 mmHg or DBP greater than 110 mmHg until  1500 on 07/12/2023. Monitor on telemetry, including monitoring for atrial fibrillation as modifiable risk factor for acute ischemic CVA.   MRI  brain tomorrow AM, with availability of general anesthesia, as further detailed above TTE with bubble study has been ordered for the morning. Additionally, as component of evaluation of potential modifiable ischemic CVA risk factors, will also check lipid panel and A1c. PT/OT/ST consults have been ordered for the morning.  Neurology to formally consult, as above, and will follow for their recommendations.  Check UDS.                #) Hypotension: Now resolved ; EMS noted soft initial blood pressures, with initial blood pressure at [facility] emergency department noted to be 83/58, improving with 1 liter of IV fluids, now normotensive.  He is not on any antihypertensive medications as an outpatient, but is noted to be on Flomax in the setting of BPH.  Differential includes potential hypovolemic contribution given improvement in blood pressure with IV fluids.  No overt evidence of active blood loss, and hemoglobin was found to be 14.7.  Neurogenic hypotension in  the setting of potential TIA versus stroke is within the differential, noting that the patient's neurologic deficits resolved while his blood pressure remained soft.  ACS appears less likely at this time in the absence of any chest pain, while troponin x 2 are nonelevated and flat, will EKG shows no evidence of acute ischemic changes.  Obstructive sources, including acute pulmonary embolism.  Clinically less likely at this time.  He does have positive SIRS criteria, as further detailed below, but no evidence of underlying infectious process at this time.  Will further evaluate for underlying infection, as outlined below, although septic contribution appears less likely at this time.  Plan: Monitor strict I's and O's and daily weights.  Monitor on telemetry.  Hold home Flomax for now.  Normal saline at 100 cc/h x 10 hours check urinalysis, procalcitonin level.  Repeat CMP, CBC in the morning.  Check urinary drug screen.  Follow-up  result of echocardiogram, as above.  Serial neurochecks, as above.               #) SIRS criteria present: Presentation associated with mild leukocytosis, with no evidence of Peja Allender 12,100, as well as tachycardia.  However, in the absence of e/o underlying infection at this time, including, chest x-ray that showed no evidence of acute cardiopulmonary process, including evidence of infiltrate, criteria for sepsis not currently met.  CTA neck showed some evidence of bronchospasm is COVID in the absence of any associated acute respiratory symptoms.  Will add on procalcitonin level to further assess.  Suspect non-infectious factors contributing to these SIRS findings, including potential reactive contribution towards leukocytosis in the setting of initial hypotension versus hemoconcentration element given clinical evidence of dehydration, particular given improvement in blood pressure with IV fluids. patient appears hemodynamically stable at this time and normotensive following initial IV fluids.  Consequently, will refrain from initiation of IV antibiotics at this time.   Plan: Repeat CBC with diff in the AM.  Monitor strict I's and O's and daily weights.  Monitor on telemetry. Refraining from IV abx for now, as above.  Check urinalysis, procalcitonin level.  Gentle IV fluids, as above.                    #) Depression: documented h/o such. On Abilify as outpatient.    Plan: Continue outpatient Abilify.             #) liver lesion: Per the patient, he has a history of hepatic lesions, which outpatient providers have conveyed may represent metastatic disease.  The patient conveys that his outpatient providers have been trying to pursue an MRI of the abdomen with and without contrast to further evaluate these lesions.  Given the patient's significant claustrophobia, exacerbated in scenario such as MRI, the patient is inquiring about the possibility of pursuing the  intended outpatient MRI of the abdomen with and without contrast in tandem with the plan for MRI brain during this hospitalization.  Plan: MRI abdomen with and without contrast during this hospitalization has been ordered.               #) GERD: documented h/o such; on  PPI as outpatient.   Plan: continue home PPI.                   #) Allergic Rhinitis: documented h/o such, on cetirizine , in the absence of any intranasal corticosteroid , as outpatient.    Plan: cont home cetirizine.                     #)  Benign Prostatic Hyperplasia:  documented h/o such; on tamsulosin as outpatient.   Plan: monitor strict I's & O's and daily weights. Repeat CMP in AM.  Hold home tamsulosin for now in the setting of initial hypotension.     DVT prophylaxis: SCD's   Code Status: Full code Family Communication: none Disposition Plan: Per Rounding Team Consults called: EDP d/w on neurology, Dr. Selina Cooley, who will consult, as further detailed above;  Admission status: Observation     I SPENT GREATER THAN 75  MINUTES IN CLINICAL CARE TIME/MEDICAL DECISION-MAKING IN COMPLETING THIS ADMISSION.      Chaney Born Teylor Wolven DO [facility]  From 7PM - 7AM   07/10/2023, 8:30 PM

## 2023-07-10 NOTE — Consult Note (Signed)
NEUROLOGY CONSULT NOTE   Date of service: July 10, 2023 Patient Name: Edward Hoover. MRN:  [id number] DOB:  04/16/1961 Chief Complaint: "TIA" Requesting Provider: Angie Fava, DO  History of Present Illness  Daishawn Lauf. is a 62 y.o. male GERD, depression, asthma, who presents with episode of lightheadedness, blurred vision, presyncope while at work. He was on his desk taking a phone call when he had an episode of significant lightheadedness and almost passing out. He noticed that the left half of his vision was significantly blurred whether he looked to the left with his left eye or his R eye. This lasted 10-15 mins. He went into his boss;s office and EMS called and he was hypotensive to 80s/50s and improved with fluids. Symptoms had resolved by the time of arrival of EMS.  Has had syncope/presyncopal episodes in the past but never had blurred vision. No prior hx of strokes, endorses family hx of strokes. Does not smoke, occasional EtOH use. Does use Delta 9 vape. Does not have diabetes.  LKW: afternoon, resolved completely Modified rankin score: 0-Completely asymptomatic and back to baseline post- stroke IV Thrombolysis: not offered due to resolution of symptoms. EVT: not offered, no LVO    ROS  Comprehensive ROS performed and pertinent positives documented in HPI  Past History   Past Medical History:  Diagnosis Date   Adjustment insomnia    Asthma    BCC (basal cell carcinoma), face    Bowel obstruction (HCC) 2017   Depression    Diverticulitis    Enlarged prostate    GERD (gastroesophageal reflux disease)     Past Surgical History:  Procedure Laterality Date   ABDOMINAL SURGERY     CATARACT EXTRACTION, BILATERAL     LAPAROTOMY N/A 01/22/2018   Procedure: EXPLORATORY LAPAROTOMY LYSIS OF ADHESIONS;  Surgeon: Romie Levee, MD;  Location: [facility];  Service: General;  Laterality: N/A;   SHOULDER SURGERY      Family History: Family History   Problem Relation Age of Onset   Diabetes Other    Hypertension Other    CAD Other     Social History  reports that he has never smoked. He uses smokeless tobacco. He reports current alcohol use of about 3.0 standard drinks of alcohol per week. He reports that he does not use drugs.  Allergies  Allergen Reactions   Nsaids Anaphylaxis    Medications   Current Facility-Administered Medications:     stroke: early stages of recovery book, , Does not apply, Once, Howerter, Justin B, DO   0.9 %  sodium chloride infusion, , Intravenous, Continuous, Howerter, Justin B, DO   acetaminophen (TYLENOL) tablet 650 mg, 650 mg, Oral, Q6H PRN **OR** acetaminophen (TYLENOL) suppository 650 mg, 650 mg, Rectal, Q6H PRN, Howerter, Justin B, DO   [START ON 07/11/2023] ARIPiprazole (ABILIFY) tablet 10 mg, 10 mg, Oral, Daily, Howerter, Justin B, DO   hydrALAZINE (APRESOLINE) injection 10 mg, 10 mg, Intravenous, Q4H PRN, Howerter, Justin B, DO   [START ON 07/11/2023] loratadine (CLARITIN) tablet 10 mg, 10 mg, Oral, Daily, Howerter, Justin B, DO   melatonin tablet 3 mg, 3 mg, Oral, QHS PRN, Howerter, Justin B, DO   ondansetron (ZOFRAN) injection 4 mg, 4 mg, Intravenous, Q6H PRN, Howerter, Justin B, DO   [START ON 07/11/2023] pantoprazole (PROTONIX) EC tablet 40 mg, 40 mg, Oral, Daily, Howerter, Justin B, DO  Current Outpatient Medications:    ARIPiprazole (ABILIFY) 10 MG tablet, Take 1 tablet by mouth  daily., Disp: , Rfl:    cetirizine (ZYRTEC) 10 MG tablet, Take 10 mg by mouth daily., Disp: , Rfl:    cyanocobalamin 1000 MCG tablet, Take 1,000 mcg by mouth in the morning and at bedtime., Disp: , Rfl:    RABEprazole (ACIPHEX) 20 MG tablet, Take 20 mg by mouth daily., Disp: , Rfl:    tadalafil (CIALIS) 20 MG tablet, Take 20 mg by mouth daily as needed for erectile dysfunction., Disp: , Rfl:    tamsulosin (FLOMAX) 0.4 MG CAPS capsule, TAKE 1 CAPSULE BEFORE BED (Patient taking differently: Take 0.4 mg by mouth at  bedtime.), Disp: 90 capsule, Rfl: 3   traZODone (DESYREL) 100 MG tablet, TAKE 1 TABLET AT BEDTIME (Patient taking differently: Take 100 mg by mouth at bedtime.), Disp: 90 tablet, Rfl: 3  Vitals   Vitals:   08-08-2023 1900 08/08/23 1915 08-08-23 1930 08-08-2023 1943  BP: 111/74 107/89 111/71 111/72  Pulse: 62 (!) 58 67 63  Resp: 18 18 15 19   Temp:    98.3 F (36.8 C)  TempSrc:    Oral  SpO2: 100% 100% 100% 99%  Weight:      Height:        Body mass index is 29.29 kg/m.  Physical Exam   Constitutional: Appears well-developed and well-nourished.  Psych: Affect appropriate to situation.  Eyes: No scleral injection.  HENT: No OP obstruction.  Head: Normocephalic.  Cardiovascular: Normal rate and regular rhythm.  Respiratory: Effort normal, non-labored breathing.  GI: Soft.  No distension. There is no tenderness.  Skin: WDI.    Neurologic Examination  Mental status/Cognition: Alert, oriented to self, place, month and year, good attention.  Speech/language: Fluent, comprehension intact, object naming intact, repetition intact.  Cranial nerves:   CN II Pupils equal and reactive to light, no VF deficits    CN III,IV,VI EOM intact, no gaze preference or deviation, no nystagmus    CN V normal sensation in V1, V2, and V3 segments bilaterally    CN VII no asymmetry, no nasolabial fold flattening    CN VIII normal hearing to speech    CN IX & X normal palatal elevation, no uvular deviation    CN XI 5/5 head turn and 5/5 shoulder shrug bilaterally    CN XII midline tongue protrusion    Motor:  Muscle bulk: normal, tone normal, pronator drift none tremor none Mvmt Root Nerve  Muscle Right Left Comments  SA C5/6 Ax Deltoid 5 5   EF C5/6 Mc Biceps 5 5   EE C6/7/8 Rad Triceps 5 5   WF C6/7 Med FCR     WE C7/8 PIN ECU     F Ab C8/T1 U ADM/FDI 5 5   HF L1/2/3 Fem Illopsoas 5 5   KE L2/3/4 Fem Quad 5 5   DF L4/5 D Peron Tib Ant 5 5   PF S1/2 Tibial Grc/Sol 5 5    Sensation:  Light  touch Intact throughout   Pin prick    Temperature    Vibration   Proprioception    Coordination/Complex Motor:  - Finger to Nose intact BL - Heel to shin intact BL - Rapid alternating movement are normal - Gait: deferred.   Labs/Imaging/Neurodiagnostic studies   CBC:  Recent Labs  Lab August 08, 2023 1606  WBC 12.1*  NEUTROABS 9.1*  HGB 14.7  HCT 42.6  MCV 95.7  PLT 248    Basic Metabolic Panel:  Lab Results  Component Value Date   NA  136 07/10/2023   K 3.6 07/10/2023   CO2 25 07/10/2023   GLUCOSE 96 07/10/2023   BUN 12 07/10/2023   CREATININE 1.09 07/10/2023   CALCIUM 9.3 07/10/2023   GFRNONAA >60 07/10/2023   GFRAA 88 11/18/2019    Lipid Panel:  Lab Results  Component Value Date   LDLCALC 93 10/07/2021    HgbA1c: No results found for: "HGBA1C"  Urine Drug Screen: No results found for: "LABOPIA", "COCAINSCRNUR", "LABBENZ", "AMPHETMU", "THCU", "LABBARB"   Alcohol Level No results found for: "ETH"  INR No results found for: "INR"  APTT No results found for: "APTT"  AED levels: No results found for: "PHENYTOIN", "ZONISAMIDE", "LAMOTRIGINE", "LEVETIRACETA"    CT Head without contrast(Personally reviewed): CTH was negative for a large hypodensity concerning for a large territory infarct or hyperdensity concerning for an ICH  CT angio Head and Neck with contrast(Personally reviewed): No LVO  MRI Brain: pending   Impression   Garnett Nunziata. is a 62 y.o. male GERD, depression, asthma, who presents with episode of lightheadedness, blurred vision, presyncope while at work along with 10-15 mins episode of L homonymous hemianopsia. Unilateral homononymous hemianopsia is concerning for potential CVA.  Recommendations  - Frequent Neuro checks per stroke unit protocol - Recommend brain imaging with MRI Brain without contrast - Recommend obtaining TTE with bubble study - Recommend obtaining Lipid panel with LDL - Please start statin if LDL > 70 -  Recommend HbA1c to evaluate for diabetes and how well it is controlled. - Antithrombotic - aspirin 81mg  daily along with plavix 75mg  daily x 21 days, followed by Aspirin 81mg  daily alone. - Recommend DVT ppx - SBP goal - permissive hypertension first 24 h < 220/110. Held home meds.  - Recommend Telemetry monitoring for arrythmia - Recommend bedside swallow screen prior to PO intake. - Stroke education booklet - Recommend PT/OT/SLP consult - Recommend Urine Tox screen. - counseled him on the importance of quitting Delta 9 vape.  ______________________________________________________________________    Welton Flakes [facility]

## 2023-07-10 NOTE — ED Provider Notes (Signed)
[name] AT [facility] Provider Note   CSN: [id number] Arrival date & time: 07/10/23  1556     History  Chief Complaint  Patient presents with   Near Syncope    Edward Hoover. is a 62 y.o. male.  62 year old male with past medical history of asthma and diverticulitis in the past presenting to the emergency department today after an episode of lightheadedness, blurred vision, and presyncope that occurred at work 1.5 hours ago.  The patient states that he was at work and he started to become very lightheaded.  He thought that he was going to pass out.  He then noted that he had a visual disturbance in the left side of his visual fields in the left and right eye.  The patient states that this lasted for few minutes.  He felt confused at the time.  He states that he had a very mild headache with this.  This was global in character.  He states that he is feeling better since he is arrived here.  He was found to be hypotensive on arrival.  The patient denies any new medications.  He denies any fevers or chills.  Denies any cough, sore throat, rhinorrhea, nausea, vomiting, diarrhea, abdominal pain, chest pain, or other associated symptoms.  He states that he is feeling back to normal now currently.   Near Syncope       Home Medications Prior to Admission medications   Medication Sig Start Date End Date Taking? Authorizing Provider  cetirizine (ZYRTEC) 10 MG tablet Take 10 mg by mouth daily.    [provider]  cyanocobalamin 1000 MCG tablet Take 1,000 mcg by mouth in the morning and at bedtime.    [provider]  lansoprazole (PREVACID) 30 MG capsule TAKE 1 CAPSULE BY MOUTH TWICE DAILY 10/11/20   Cox, Kirsten, MD  ondansetron (ZOFRAN) 4 MG tablet Take 1 tablet (4 mg total) by mouth every 6 (six) hours as needed for nausea or vomiting. 04/20/23   Glyn Ade, MD  oxyCODONE (ROXICODONE) 5 MG immediate release tablet Take 1  tablet (5 mg total) by mouth every 4 (four) hours as needed for severe pain. 04/20/23   Glyn Ade, MD  tamsulosin (FLOMAX) 0.4 MG CAPS capsule TAKE 1 CAPSULE BEFORE BED 10/20/19   Cox, Fritzi Mandes, MD  temazepam (RESTORIL) 15 MG capsule Take 15 mg by mouth at bedtime as needed for sleep.    [provider]  traZODone (DESYREL) 100 MG tablet TAKE 1 TABLET AT BEDTIME 04/15/20   CoxFritzi Mandes, MD      Allergies    Nsaids    Review of Systems   Review of Systems  Eyes:  Positive for visual disturbance.  Cardiovascular:  Positive for near-syncope.  Neurological:  Positive for light-headedness.  All other systems reviewed and are negative.   Physical Exam Updated Vital Signs BP 107/89   Pulse (!) 58   Temp 98.1 F (36.7 C) (Oral)   Resp 18   Ht 5\' 11"  (1.803 m)   Wt 95.3 kg   SpO2 100%   BMI 29.29 kg/m  Physical Exam Vitals and nursing note reviewed.   Gen: NAD Eyes: PERRL, EOMI HEENT: no oropharyngeal swelling Neck: trachea midline Resp: clear to auscultation bilaterally Card: RRR, no murmurs, rubs, or gallops Abd: nontender, nondistended Extremities: no calf tenderness, no edema Vascular: 2+ radial pulses bilaterally, 2+ DP pulses bilaterally Neuro: NIH stroke scale of 0 Skin: no rashes Psyc: acting  appropriately   ED Results / Procedures / Treatments   Labs (all labs ordered are listed, but only abnormal results are displayed) Labs Reviewed  CBC WITH DIFFERENTIAL/PLATELET - Abnormal; Notable for the following components:      Result Value   WBC 12.1 (*)    Neutro Abs 9.1 (*)    All other components within normal limits  COMPREHENSIVE METABOLIC PANEL  CBG MONITORING, ED  I-STAT CG4 LACTIC ACID, ED  I-STAT CG4 LACTIC ACID, ED  TROPONIN I (HIGH SENSITIVITY)  TROPONIN I (HIGH SENSITIVITY)    EKG EKG Interpretation Date/Time:  Tuesday July 10 2023 16:10:00 EST Ventricular Rate:  54 PR Interval:  138 QRS Duration:  92 QT Interval:  426 QTC  Calculation: 403 R Axis:   16  Text Interpretation: Sinus bradycardia Otherwise normal ECG No previous ECGs available Confirmed by Beckey Downing [phone]) on 07/10/2023 5:09:42 PM  Radiology CT ANGIO HEAD NECK W WO CM  Result Date: 07/10/2023 CLINICAL DATA:  Cerebral aneurysm screening, high risk; near syncope EXAM: CT ANGIOGRAPHY HEAD AND NECK WITH AND WITHOUT CONTRAST TECHNIQUE: Multidetector CT imaging of the head and neck was performed using the standard protocol during bolus administration of intravenous contrast. Multiplanar CT image reconstructions and MIPs were obtained to evaluate the vascular anatomy. Carotid stenosis measurements (when applicable) are obtained utilizing NASCET criteria, using the distal internal carotid diameter as the denominator. RADIATION DOSE REDUCTION: This exam was performed according to the departmental dose-optimization program which includes automated exposure control, adjustment of the mA and/or kV according to patient size and/or use of iterative reconstruction technique. CONTRAST:  75mL OMNIPAQUE IOHEXOL 350 MG/ML SOLN COMPARISON:  07/10/2023 CT head, no prior CTA FINDINGS: CT HEAD FINDINGS For noncontrast findings, please see same day CT head. CTA NECK FINDINGS Aortic arch: Standard branching. Imaged portion shows no evidence of aneurysm or dissection. No significant stenosis of the major arch vessel origins. Right carotid system: No evidence of dissection, occlusion, or hemodynamically significant stenosis (greater than 50%). Left carotid system: No evidence of dissection, occlusion, or hemodynamically significant stenosis (greater than 50%). Vertebral arteries: No evidence of dissection, occlusion, or hemodynamically significant stenosis (greater than 50%). Skeleton: No acute osseous abnormality. Degenerative changes in the cervical spine. Other neck: No acute finding. Upper chest: Bronchial wall thickening. A few ground-glass opacities are noted in the right upper lobe  (series 7, image 346 and 359). Emphysema. No pleural effusion. Review of the MIP images confirms the above findings CTA HEAD FINDINGS Anterior circulation: Both internal carotid arteries are patent to the termini, without significant stenosis. A1 segments patent. Normal anterior communicating artery. Anterior cerebral arteries are patent to their distal aspects without significant stenosis. No M1 stenosis or occlusion. MCA branches perfused to their distal aspects without significant stenosis. Posterior circulation: Vertebral arteries patent to the vertebrobasilar junction without significant stenosis. Posterior inferior cerebellar arteries patent proximally. Basilar patent to its distal aspect without significant stenosis. Superior cerebellar arteries patent proximally. Patent P1 segments. PCAs perfused to their distal aspects without significant stenosis. The bilateral posterior communicating arteries are not visualized. Venous sinuses: As permitted by contrast timing, patent. Anatomic variants: None significant. No evidence of aneurysm or vascular malformation. Review of the MIP images confirms the above findings IMPRESSION: 1. No intracranial large vessel occlusion or significant stenosis. 2. No hemodynamically significant stenosis in the neck. 3. Bronchial wall thickening with a few ground-glass opacities in the right upper lobe, which could be infectious or inflammatory. Electronically Signed   By: Jill Side  Vasan M.D.   On: 07/10/2023 19:28   DG Chest Port 1 View  Result Date: 07/10/2023 CLINICAL DATA:  Blurred vision, difficulty forming words, dizziness EXAM: PORTABLE CHEST 1 VIEW COMPARISON:  05/17/2023 FINDINGS: Stable cardiomediastinal silhouette. Left basilar atelectasis/scarring. Otherwise no focal consolidation, pleural effusion, or pneumothorax. No displaced rib fractures. IMPRESSION: No acute cardiopulmonary disease. Electronically Signed   By: Minerva Fester M.D.   On: 07/10/2023 18:53   CT  Head Wo Contrast  Result Date: 07/10/2023 CLINICAL DATA:  Syncope, altered mental status EXAM: CT HEAD WITHOUT CONTRAST TECHNIQUE: Contiguous axial images were obtained from the base of the skull through the vertex without intravenous contrast. RADIATION DOSE REDUCTION: This exam was performed according to the departmental dose-optimization program which includes automated exposure control, adjustment of the mA and/or kV according to patient size and/or use of iterative reconstruction technique. COMPARISON:  None Available. FINDINGS: Brain: There is no acute intracranial hemorrhage, extra-axial fluid collection, or acute infarct. Parenchymal volume is normal. The ventricles are normal in size. Gray-white differentiation is preserved. The pituitary and suprasellar region are normal. There is no mass lesion. There is no mass effect or midline shift. Vascular: No hyperdense vessel or unexpected calcification. Skull: Normal. Negative for fracture or focal lesion. Sinuses/Orbits: The paranasal sinuses are clear. Bilateral lens implants are in place. The globes and orbits are otherwise unremarkable. Other: The mastoid air cells and middle ear cavities are clear. IMPRESSION: No acute intracranial pathology. Electronically Signed   By: Lesia Hausen M.D.   On: 07/10/2023 17:31    Procedures Procedures    Medications Ordered in ED Medications  sodium chloride 0.9 % bolus 1,000 mL (0 mLs Intravenous Stopped 07/10/23 1755)  iohexol (OMNIPAQUE) 350 MG/ML injection 75 mL (75 mLs Intravenous Contrast Given 07/10/23 1821)    ED Course/ Medical Decision Making/ A&P                                 Medical Decision Making 62 year old male with past medical history of asthma and diverticulitis in the past presents the emergency department today with lightheadedness and left visual field deficit that has since resolved.  I will further evaluate the patient here with a cardiac workup.  The patient denies any headache at  this time.  This may be due to complex migraine given the associated headache.  Will also obtain a CT scan of the patient's head as well as CT angiogram of his head and neck to evaluate for vascular abnormalities or lesions.  Will give the patient on the monitor.  His blood pressure is improved with very minimal IV fluid resuscitation here.  I will discuss his case with neurology after his workup is complete for ultimate disposition.  The patient CT imaging is unremarkable.  His labs are reassuring.  Does have mild leukocytosis.  On his CT angiogram there were no intracranial findings but there was possible groundglass opacities.  The patient denies any cough at all at this time.  He does not have any symptoms consistent with this.  I did call and discussed his case with Dr. Selina Cooley.  She recommends admission for further workup for amaurosis fugax.  Amount and/or Complexity of Data Reviewed Radiology: ordered.  Risk Prescription drug management. Decision regarding hospitalization.          Final Clinical Impression(s) / ED Diagnoses Final diagnoses:  Amaurosis fugax    Rx / DC Orders ED Discharge Orders  None         Durwin Glaze, MD 07/10/23 [phone]

## 2023-07-11 ENCOUNTER — Observation Stay (HOSPITAL_COMMUNITY)

## 2023-07-11 ENCOUNTER — Observation Stay (HOSPITAL_BASED_OUTPATIENT_CLINIC_OR_DEPARTMENT_OTHER)

## 2023-07-11 ENCOUNTER — Encounter (HOSPITAL_COMMUNITY): Admission: EM | Disposition: A | Discharge status: Home / Self Care | Payer: Self-pay | Source: Home / Self Care

## 2023-07-11 ENCOUNTER — Other Ambulatory Visit (HOSPITAL_COMMUNITY): Payer: Self-pay

## 2023-07-11 DIAGNOSIS — G459 Transient cerebral ischemic attack, unspecified: Secondary | ICD-10-CM

## 2023-07-11 DIAGNOSIS — R55 Syncope and collapse: Secondary | ICD-10-CM

## 2023-07-11 HISTORY — PX: LOOP RECORDER INSERTION: EP1214

## 2023-07-11 LAB — CBC WITH DIFFERENTIAL/PLATELET
Abs Immature Granulocytes: 0.02 10*3/uL (ref 0.00–0.07)
Basophils Absolute: 0 10*3/uL (ref 0.0–0.1)
Basophils Relative: 0 %
Eosinophils Absolute: 0.1 10*3/uL (ref 0.0–0.5)
Eosinophils Relative: 1 %
HCT: 37.1 % — ABNORMAL LOW (ref 39.0–52.0)
Hemoglobin: 12.6 g/dL — ABNORMAL LOW (ref 13.0–17.0)
Immature Granulocytes: 0 %
Lymphocytes Relative: 36 %
Lymphs Abs: 2.7 10*3/uL (ref 0.7–4.0)
MCH: 32.5 pg (ref 26.0–34.0)
MCHC: 34 g/dL (ref 30.0–36.0)
MCV: 95.6 fL (ref 80.0–100.0)
Monocytes Absolute: 0.5 10*3/uL (ref 0.1–1.0)
Monocytes Relative: 7 %
Neutro Abs: 4.1 10*3/uL (ref 1.7–7.7)
Neutrophils Relative %: 56 %
Platelets: 214 10*3/uL (ref 150–400)
RBC: 3.88 MIL/uL — ABNORMAL LOW (ref 4.22–5.81)
RDW: 11.9 % (ref 11.5–15.5)
WBC: 7.3 10*3/uL (ref 4.0–10.5)
nRBC: 0 % (ref 0.0–0.2)

## 2023-07-11 LAB — PROCALCITONIN: Procalcitonin: <0.1 ng/mL

## 2023-07-11 LAB — COMPREHENSIVE METABOLIC PANEL
ALT: 18 U/L (ref 0–44)
AST: 15 U/L (ref 15–41)
Albumin: 3.2 g/dL — ABNORMAL LOW (ref 3.5–5.0)
Alkaline Phosphatase: 38 U/L (ref 38–126)
Anion gap: 8 (ref 5–15)
BUN: 9 mg/dL (ref 8–23)
CO2: 23 mmol/L (ref 22–32)
Calcium: 8.6 mg/dL — ABNORMAL LOW (ref 8.9–10.3)
Chloride: 107 mmol/L (ref 98–111)
Creatinine, Ser: 1.04 mg/dL (ref 0.61–1.24)
GFR, Estimated: >60 mL/min (ref >60)
Glucose, Bld: 107 mg/dL — ABNORMAL HIGH (ref 70–99)
Potassium: 3.5 mmol/L (ref 3.5–5.1)
Sodium: 138 mmol/L (ref 135–145)
Total Bilirubin: 1.1 mg/dL (ref <1.2)
Total Protein: 5.5 g/dL — ABNORMAL LOW (ref 6.5–8.1)

## 2023-07-11 LAB — LIPID PANEL
Cholesterol: 135 mg/dL (ref 0–200)
HDL: 37 mg/dL — ABNORMAL LOW (ref >40)
LDL Cholesterol: 85 mg/dL (ref 0–99)
Total CHOL/HDL Ratio: 3.6 {ratio}
Triglycerides: 67 mg/dL (ref <150)
VLDL: 13 mg/dL (ref 0–40)

## 2023-07-11 LAB — MAGNESIUM: Magnesium: 1.7 mg/dL (ref 1.7–2.4)

## 2023-07-11 LAB — ECHOCARDIOGRAM COMPLETE BUBBLE STUDY
AR max vel: 2.02 cm2
AV Area VTI: 2.22 cm2
AV Area mean vel: 1.98 cm2
AV Mean grad: 4 mm[Hg]
AV Peak grad: 8.4 mm[Hg]
Ao pk vel: 1.45 m/s
Area-P 1/2: 3.48 cm2
S' Lateral: 2.6 cm

## 2023-07-11 SURGERY — LOOP RECORDER INSERTION

## 2023-07-11 MED ORDER — CLOPIDOGREL BISULFATE 75 MG PO TABS
75.0000 mg | ORAL_TABLET | Freq: Every day | ORAL | Status: DC
  Administered 2023-07-11: 75 mg via ORAL
  Filled 2023-07-11: qty 1

## 2023-07-11 MED ORDER — GADOBUTROL 1 MMOL/ML IV SOLN
8.0000 mL | Freq: Once | INTRAVENOUS | Status: AC | PRN
Start: 2023-07-11 — End: 2023-07-11
  Administered 2023-07-11: 8 mL via INTRAVENOUS

## 2023-07-11 MED ORDER — LIDOCAINE-EPINEPHRINE 1 %-1:100000 IJ SOLN
INTRAMUSCULAR | Status: AC
  Filled 2023-07-11: qty 1

## 2023-07-11 MED ORDER — ROSUVASTATIN CALCIUM 20 MG PO TABS
20.0000 mg | ORAL_TABLET | Freq: Every day | ORAL | Status: DC
  Administered 2023-07-11: 20 mg via ORAL
  Filled 2023-07-11: qty 1

## 2023-07-11 MED ORDER — ASPIRIN 81 MG PO CHEW
81.0000 mg | CHEWABLE_TABLET | Freq: Every day | ORAL | Status: DC
  Filled 2023-07-11: qty 1

## 2023-07-11 MED ORDER — CLOPIDOGREL BISULFATE 75 MG PO TABS
75.0000 mg | ORAL_TABLET | Freq: Every day | ORAL | 0 refills | Status: AC
  Filled 2023-07-11: qty 30, 30d supply, fill #0

## 2023-07-11 MED ORDER — LIDOCAINE-EPINEPHRINE 1 %-1:100000 IJ SOLN
INTRAMUSCULAR | Status: DC | PRN
  Administered 2023-07-11: 20 mL

## 2023-07-11 SURGICAL SUPPLY — 2 items
MONITOR LUX-DX II+ M312 (Prosthesis & Implant Heart) IMPLANT
PACK LOOP INSERTION (CUSTOM PROCEDURE TRAY) ×1 IMPLANT

## 2023-07-11 NOTE — Progress Notes (Signed)
Received back in room from MRI.

## 2023-07-11 NOTE — Discharge Summary (Signed)
Physician Discharge Summary   Patient: Edward Hoover. MRN: [id number] DOB: November 23, 1960  Admit date:     07/10/2023  Discharge date: 07/11/23  Discharge Physician: Jacquelin Hawking, MD   PCP: Verlee Rossetti, PA-C   Recommendations at discharge:  PCP follow-up Neurology follow-up Cardiology follow-up  Discharge Diagnoses: Principal Problem:   TIA (transient ischemic attack) Active Problems:   GERD (gastroesophageal reflux disease)   Expressive aphasia   Hypotension   SIRS (systemic inflammatory response syndrome) (HCC)   Leukocytosis   Depression   Allergic rhinitis   BPH (benign prostatic hyperplasia)  Resolved Problems:   * No resolved hospital problems. *  Hospital Course: Edward Hoover. is a 62 y.o. male with a history of BPH, depression.  Patient presented secondary to blurry vision with concern for TIA. Stroke workup was negative. Neurology recommendation for dual antiplatelet therapy and lipid-lowering management. Loop recorder placed prior to discharge. No therapy follow-up.  Assessment and Plan:  TIA MRI confirms no stroke. CTA head and neck significant for no intracranial large vessel occlusion or significant stenosis. LDL of 85. Hemoglobin A1C of 5.0%. Transthoracic Echocardiogram significant for negative bubble study. Neurology recommendations for for dual antiplatelet therapy with aspirin and Plavix for 3 weeks followed by aspirin alone. Patient has declined statin and Zeti; risks explained. Patient has declined aspirin with reason given being secondary to NSAID allergy. PT/OT recommendations for no follow-up. Patient discharged on Plavix monotherapy as patient has declined aspirin and lipid-lowering therapies. Loop recorder placed prior to discharge. Recommendation for Neurology follow-up in 4 weeks.  Hypotension Noted by EMS and present while in the emergency department. Resolved with IV fluids.  Benign liver cysts Noted on MRI abdomen. No  follow-up recommended  SIRS Noted on admission. No evidence of infectious source. Leukocytosis resolved prior to discharge.  Depression Continue Abilify  Right upper lobe opacities Asymptomatic. Monitor and treat if patient develops symptoms.  GERD Continue rabeprazole.  Allergic rhinitis Continue cetirizine.  BPH Continue tamsulosin.   Consultants: Neurology Cardiology  Procedures performed:  Transthoracic Echocardiogram Loop recorder placement   Disposition: Home Diet recommendation: Cardiac diet   DISCHARGE MEDICATION: Allergies as of 07/11/2023       Reactions   Nsaids Anaphylaxis        Medication List     TAKE these medications    ARIPiprazole 10 MG tablet Commonly known as: ABILIFY Take 1 tablet by mouth daily.   cetirizine 10 MG tablet Commonly known as: ZYRTEC Take 10 mg by mouth daily.   clopidogrel 75 MG tablet Commonly known as: PLAVIX Take 1 tablet (75 mg total) by mouth daily. Start taking on: July 12, 2023   cyanocobalamin 1000 MCG tablet Take 1,000 mcg by mouth in the morning and at bedtime.   RABEprazole 20 MG tablet Commonly known as: ACIPHEX Take 20 mg by mouth daily.   tadalafil 20 MG tablet Commonly known as: CIALIS Take 20 mg by mouth daily as needed for erectile dysfunction.   tamsulosin 0.4 MG Caps capsule Commonly known as: FLOMAX TAKE 1 CAPSULE BEFORE BED What changed: See the new instructions.   traZODone 100 MG tablet Commonly known as: DESYREL TAKE 1 TABLET AT BEDTIME        Follow-up Information     Verlee Rossetti, PA-C. Schedule an appointment as soon as possible for a visit in 1 week(s).   Specialty: Physician Assistant Why: For hospital follow-up Contact information: [address] [place] [address] [phone]  Micki Riley, MD. Schedule an appointment as soon as possible for a visit in 4 week(s).   Specialties: Neurology, Radiology Why: For hospital  follow-up Contact information: [address] [place] [address] [phone]                Discharge Exam: BP 102/63 (BP Location: Left Arm)   Pulse 67   Temp 98 F (36.7 C) (Oral)   Resp 18   Ht 5\' 11"  (1.803 m)   Wt 86.1 kg   SpO2 97%   BMI 26.47 kg/m   General exam: Appears calm and comfortable Respiratory system: Clear to auscultation. Respiratory effort normal. Cardiovascular system: S1 & S2 heard, RRR. No murmurs, rubs, gallops or clicks. Gastrointestinal system: Abdomen is nondistended, soft and nontender. Normal bowel sounds heard. Central nervous system: Alert and oriented. No focal neurological deficits. Musculoskeletal: No edema. No calf tenderness Skin: No cyanosis. No rashes Psychiatry: Judgement and insight appear normal. Mood & affect appropriate.   Condition at discharge: stable  The results of significant diagnostics from this hospitalization (including imaging, microbiology, ancillary and laboratory) are listed below for reference.   Imaging Studies: MR BRAIN WO CONTRAST  Result Date: 07/11/2023 CLINICAL DATA:  Transient ischemic attack EXAM: MRI HEAD WITHOUT CONTRAST TECHNIQUE: Multiplanar, multiecho pulse sequences of the brain and surrounding structures were obtained without intravenous contrast. COMPARISON:  No prior MRI available, correlation is made with CT head 07/10/2023 FINDINGS: Brain: No restricted diffusion to suggest acute or subacute infarct. No acute hemorrhage, mass, mass effect, or midline shift. No hydrocephalus or extra-axial collection. Pituitary and craniocervical junction within normal limits. No hemosiderin deposition to suggest remote hemorrhage. Vascular: Normal arterial flow voids. Skull and upper cervical spine: Normal marrow signal. Sinuses/Orbits: Mucosal thickening in the ethmoid air cells. No acute finding in the orbits. Status post bilateral lens replacements. Other: The mastoid air cells are well aerated.  IMPRESSION: No acute intracranial process. No evidence of acute or subacute infarct. Electronically Signed   By: Wiliam Ke M.D.   On: 07/11/2023 13:34   ECHOCARDIOGRAM COMPLETE BUBBLE STUDY  Result Date: 07/11/2023    ECHOCARDIOGRAM REPORT   Patient Name:   Edward Hoover. Date of Exam: 07/11/2023 Medical Rec #:  [id number]                Height:       71.0 in Accession #:    [id number]               Weight:       189.8 lb Date of Birth:  [birth year]                BSA:          2.062 m Patient Age:    62 years                 BP:           108/75 mmHg Patient Gender: M                        HR:           47 bpm. Exam Location:  Inpatient Procedure: 2D Echo, 3D Echo, Cardiac Doppler, Color Doppler and Strain Analysis Indications:    TIA  History:        Patient has prior history of Echocardiogram examinations, most  recent 09/08/2021.  Sonographer:    Karma Ganja Referring Phys: [id number] JUSTIN B HOWERTER IMPRESSIONS  1. Left ventricular ejection fraction, by estimation, is 60 to 65%. The left ventricle has normal function. The left ventricle has no regional wall motion abnormalities. There is mild left ventricular hypertrophy. Left ventricular diastolic parameters were normal. The average left ventricular global longitudinal strain is -20.4 %. The global longitudinal strain is normal.  2. Right ventricular systolic function is normal. The right ventricular size is normal. Tricuspid regurgitation signal is inadequate for assessing PA pressure.  3. The mitral valve is normal in structure. Trivial mitral valve regurgitation.  4. The aortic valve is tricuspid. Aortic valve regurgitation is not visualized. No aortic stenosis is present.  5. The inferior vena cava is normal in size with greater than 50% respiratory variability, suggesting right atrial pressure of 3 mmHg.  6. Agitated saline contrast bubble study was negative, with no evidence of any interatrial shunt. FINDINGS  Left Ventricle:  Left ventricular ejection fraction, by estimation, is 60 to 65%. The left ventricle has normal function. The left ventricle has no regional wall motion abnormalities. The average left ventricular global longitudinal strain is -20.4 %. The global longitudinal strain is normal. The left ventricular internal cavity size was normal in size. There is mild left ventricular hypertrophy. Left ventricular diastolic parameters were normal. Right Ventricle: The right ventricular size is normal. No increase in right ventricular wall thickness. Right ventricular systolic function is normal. Tricuspid regurgitation signal is inadequate for assessing PA pressure. Left Atrium: Left atrial size was normal in size. Right Atrium: Right atrial size was normal in size. Pericardium: There is no evidence of pericardial effusion. Mitral Valve: The mitral valve is normal in structure. Trivial mitral valve regurgitation. Tricuspid Valve: The tricuspid valve is normal in structure. Tricuspid valve regurgitation is trivial. Aortic Valve: The aortic valve is tricuspid. Aortic valve regurgitation is not visualized. No aortic stenosis is present. Aortic valve mean gradient measures 4.0 mmHg. Aortic valve peak gradient measures 8.4 mmHg. Aortic valve area, by VTI measures 2.22 cm. Pulmonic Valve: The pulmonic valve was not well visualized. Pulmonic valve regurgitation is not visualized. Aorta: The aortic root and ascending aorta are structurally normal, with no evidence of dilitation. Venous: The inferior vena cava is normal in size with greater than 50% respiratory variability, suggesting right atrial pressure of 3 mmHg. IAS/Shunts: The interatrial septum was not well visualized. Agitated saline contrast bubble study was negative, with no evidence of any interatrial shunt.  LEFT VENTRICLE PLAX 2D LVIDd:         4.60 cm   Diastology LVIDs:         2.60 cm   LV e' medial:    6.96 cm/s LV PW:         1.10 cm   LV E/e' medial:  9.4 LV IVS:         1.10 cm   LV e' lateral:   13.80 cm/s LVOT diam:     2.00 cm   LV E/e' lateral: 4.7 LV SV:         69 LV SV Index:   34        2D Longitudinal Strain LVOT Area:     3.14 cm  2D Strain GLS Avg:     -20.4 %                           3D Volume EF:  3D EF:        58 %                          LV EDV:       134 ml                          LV ESV:       56 ml                          LV SV:        78 ml RIGHT VENTRICLE             IVC RV Basal diam:  4.00 cm     IVC diam: 1.80 cm RV S prime:     14.30 cm/s TAPSE (M-mode): 3.0 cm LEFT ATRIUM             Index        RIGHT ATRIUM           Index LA diam:        4.40 cm 2.13 cm/m   RA Area:     18.30 cm LA Vol (A2C):   89.8 ml 43.54 ml/m  RA Volume:   51.20 ml  24.83 ml/m LA Vol (A4C):   40.3 ml 19.54 ml/m LA Biplane Vol: 59.9 ml 29.05 ml/m  AORTIC VALVE AV Area (Vmax):    2.02 cm AV Area (Vmean):   1.98 cm AV Area (VTI):     2.22 cm AV Vmax:           145.00 cm/s AV Vmean:          99.000 cm/s AV VTI:            0.313 m AV Peak Grad:      8.4 mmHg AV Mean Grad:      4.0 mmHg LVOT Vmax:         93.30 cm/s LVOT Vmean:        62.400 cm/s LVOT VTI:          0.221 m LVOT/AV VTI ratio: 0.71  AORTA Ao Root diam: 3.20 cm Ao Asc diam:  2.70 cm MITRAL VALVE MV Area (PHT): 3.48 cm    SHUNTS MV Decel Time: 218 msec    Systemic VTI:  0.22 m MV E velocity: 65.10 cm/s  Systemic Diam: 2.00 cm MV A velocity: 79.30 cm/s MV E/A ratio:  0.82 Epifanio Lesches MD Electronically signed by Epifanio Lesches MD Signature Date/Time: 07/11/2023/1:30:59 PM    Final    MR ABDOMEN W WO CONTRAST  Result Date: 07/11/2023 CLINICAL DATA:  Liver lesion EXAM: MRI ABDOMEN WITHOUT AND WITH CONTRAST TECHNIQUE: Multiplanar multisequence MR imaging of the abdomen was performed both before and after the administration of intravenous contrast. CONTRAST:  8mL GADAVIST GADOBUTROL 1 MMOL/ML IV SOLN COMPARISON:  CT abdomen pelvis, 04/20/2023 FINDINGS: Lower chest: No acute  abnormality. Hepatobiliary: No solid liver abnormality is seen. Multiple fluid signal cysts scattered throughout the liver, benign, for which no further follow-up or characterization is required. No gallstones, gallbladder wall thickening, or biliary dilatation. Pancreas: Dorsal agenesis of the pancreas. No pancreatic ductal dilatation or surrounding inflammatory changes. Spleen: Normal in size without significant abnormality. Adrenals/Urinary Tract: Adrenal glands are unremarkable. Severely atrophic right kidney. The left kidney is normal, without obvious renal calculi, solid lesion, or hydronephrosis. Stomach/Bowel: Stomach is  within normal limits. No evidence of bowel wall thickening, distention, or inflammatory changes. Vascular/Lymphatic: No significant vascular findings are present. No enlarged abdominal lymph nodes. Other: No abdominal wall hernia or abnormality. No ascites. Musculoskeletal: No acute or significant osseous findings. IMPRESSION: 1. Multiple fluid signal cysts scattered throughout the liver, benign, for which no further follow-up or characterization is required. No solid liver lesion. 2. 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 3. Severely atrophic right kidney.  No hydronephrosis. Electronically Signed   By: Jearld Lesch M.D.   On: 07/11/2023 12:54   CT ANGIO HEAD NECK W WO CM  Result Date: 07/10/2023 CLINICAL DATA:  Cerebral aneurysm screening, high risk; near syncope EXAM: CT ANGIOGRAPHY HEAD AND NECK WITH AND WITHOUT CONTRAST TECHNIQUE: Multidetector CT imaging of the head and neck was performed using the standard protocol during bolus administration of intravenous contrast. Multiplanar CT image reconstructions and MIPs were obtained to evaluate the vascular anatomy. Carotid stenosis measurements (when applicable) are obtained utilizing NASCET criteria, using the distal internal carotid diameter as the denominator. RADIATION DOSE REDUCTION: This exam was performed according  to the departmental dose-optimization program which includes automated exposure control, adjustment of the mA and/or kV according to patient size and/or use of iterative reconstruction technique. CONTRAST:  75mL OMNIPAQUE IOHEXOL 350 MG/ML SOLN COMPARISON:  07/10/2023 CT head, no prior CTA FINDINGS: CT HEAD FINDINGS For noncontrast findings, please see same day CT head. CTA NECK FINDINGS Aortic arch: Standard branching. Imaged portion shows no evidence of aneurysm or dissection. No significant stenosis of the major arch vessel origins. Right carotid system: No evidence of dissection, occlusion, or hemodynamically significant stenosis (greater than 50%). Left carotid system: No evidence of dissection, occlusion, or hemodynamically significant stenosis (greater than 50%). Vertebral arteries: No evidence of dissection, occlusion, or hemodynamically significant stenosis (greater than 50%). Skeleton: No acute osseous abnormality. Degenerative changes in the cervical spine. Other neck: No acute finding. Upper chest: Bronchial wall thickening. A few ground-glass opacities are noted in the right upper lobe (series 7, image 346 and 359). Emphysema. No pleural effusion. Review of the MIP images confirms the above findings CTA HEAD FINDINGS Anterior circulation: Both internal carotid arteries are patent to the termini, without significant stenosis. A1 segments patent. Normal anterior communicating artery. Anterior cerebral arteries are patent to their distal aspects without significant stenosis. No M1 stenosis or occlusion. MCA branches perfused to their distal aspects without significant stenosis. Posterior circulation: Vertebral arteries patent to the vertebrobasilar junction without significant stenosis. Posterior inferior cerebellar arteries patent proximally. Basilar patent to its distal aspect without significant stenosis. Superior cerebellar arteries patent proximally. Patent P1 segments. PCAs perfused to their distal  aspects without significant stenosis. The bilateral posterior communicating arteries are not visualized. Venous sinuses: As permitted by contrast timing, patent. Anatomic variants: None significant. No evidence of aneurysm or vascular malformation. Review of the MIP images confirms the above findings IMPRESSION: 1. No intracranial large vessel occlusion or significant stenosis. 2. No hemodynamically significant stenosis in the neck. 3. Bronchial wall thickening with a few ground-glass opacities in the right upper lobe, which could be infectious or inflammatory. Electronically Signed   By: Wiliam Ke M.D.   On: 07/10/2023 19:28   DG Chest Port 1 View  Result Date: 07/10/2023 CLINICAL DATA:  Blurred vision, difficulty forming words, dizziness EXAM: PORTABLE CHEST 1 VIEW COMPARISON:  05/17/2023 FINDINGS: Stable cardiomediastinal silhouette. Left basilar atelectasis/scarring. Otherwise no focal consolidation, pleural effusion, or pneumothorax. No displaced rib fractures. IMPRESSION: No acute cardiopulmonary  disease. Electronically Signed   By: Minerva Fester M.D.   On: 07/10/2023 18:53   CT Head Wo Contrast  Result Date: 07/10/2023 CLINICAL DATA:  Syncope, altered mental status EXAM: CT HEAD WITHOUT CONTRAST TECHNIQUE: Contiguous axial images were obtained from the base of the skull through the vertex without intravenous contrast. RADIATION DOSE REDUCTION: This exam was performed according to the departmental dose-optimization program which includes automated exposure control, adjustment of the mA and/or kV according to patient size and/or use of iterative reconstruction technique. COMPARISON:  None Available. FINDINGS: Brain: There is no acute intracranial hemorrhage, extra-axial fluid collection, or acute infarct. Parenchymal volume is normal. The ventricles are normal in size. Gray-white differentiation is preserved. The pituitary and suprasellar region are normal. There is no mass lesion. There is no  mass effect or midline shift. Vascular: No hyperdense vessel or unexpected calcification. Skull: Normal. Negative for fracture or focal lesion. Sinuses/Orbits: The paranasal sinuses are clear. Bilateral lens implants are in place. The globes and orbits are otherwise unremarkable. Other: The mastoid air cells and middle ear cavities are clear. IMPRESSION: No acute intracranial pathology. Electronically Signed   By: Lesia Hausen M.D.   On: 07/10/2023 17:31    Microbiology: Results for orders placed or performed during the hospital encounter of 04/20/23  SARS Coronavirus 2 by RT PCR (hospital order, performed in [facility] lab) *cepheid single result test* Anterior Nasal Swab     Status: None   Collection Time: 04/20/23  8:30 PM   Specimen: Anterior Nasal Swab  Result Value Ref Range Status   SARS Coronavirus 2 by RT PCR NEGATIVE NEGATIVE Final    Comment: (NOTE) SARS-CoV-2 target nucleic acids are NOT DETECTED.  The SARS-CoV-2 RNA is generally detectable in upper and lower respiratory specimens during the acute phase of infection. The lowest concentration of SARS-CoV-2 viral copies this assay can detect is 250 copies / mL. A negative result does not preclude SARS-CoV-2 infection and should not be used as the sole basis for treatment or other patient management decisions.  A negative result may occur with improper specimen collection / handling, submission of specimen other than nasopharyngeal swab, presence of viral mutation(s) within the areas targeted by this assay, and inadequate number of viral copies (<250 copies / mL). A negative result must be combined with clinical observations, patient history, and epidemiological information.  Fact Sheet for Patients:   RoadLapTop.co.za  Fact Sheet for Healthcare Providers: http://kim-miller.com/  This test is not yet approved or  cleared by the [place] FDA and has been authorized for  detection and/or diagnosis of SARS-CoV-2 by FDA under an Emergency Use Authorization (EUA).  This EUA will remain in effect (meaning this test can be used) for the duration of the COVID-19 declaration under Section 564(b)(1) of the Act, 21 U.S.C. section 360bbb-3(b)(1), unless the authorization is terminated or revoked sooner.  Performed at [facility], [address] [place], [place] [address]     Labs: CBC: Recent Labs  Lab 07/10/23 1606 07/11/23 0451  WBC 12.1* 7.3  NEUTROABS 9.1* 4.1  HGB 14.7 12.6*  HCT 42.6 37.1*  MCV 95.7 95.6  PLT 248 214   Basic Metabolic Panel: Recent Labs  Lab 07/10/23 1606 07/10/23 1829 07/11/23 0451  NA 136  --  138  K 3.6  --  3.5  CL 102  --  107  CO2 25  --  23  GLUCOSE 96  --  107*  BUN 12  --  9  CREATININE 1.09  --  1.04  CALCIUM 9.3  --  8.6*  MG  --  1.7 1.7   Liver Function Tests: Recent Labs  Lab 07/10/23 1606 07/11/23 0451  AST 16 15  ALT 21 18  ALKPHOS 50 38  BILITOT 0.7 1.1  PROT 6.8 5.5*  ALBUMIN 4.1 3.2*   CBG: Recent Labs  Lab 07/10/23 1607  GLUCAP 91    Discharge time spent: 35 minutes.  Signed: Jacquelin Hawking, MD [facility] 07/11/2023

## 2023-07-11 NOTE — Progress Notes (Addendum)
STROKE TEAM PROGRESS NOTE   BRIEF HPI [name]. is a 62 y.o. male with history of GERD, depression, asthma, who presents with episode of lightheadedness, blurred vision, presyncope while at work. He was on his desk taking a phone call when he had an episode of significant lightheadedness and almost passing out. He noticed that the left half of his vision was significantly blurred whether he looked to the left with his left eye or his R eye. This lasted 10-15 mins. He went into his boss;s office and EMS called and he was hypotensive to 80s/50s and improved with fluids. Symptoms had resolved by the time of arrival of EMS.    SIGNIFICANT HOSPITAL EVENTS Admitted for stroke/TIA workup  INTERIM HISTORY/SUBJECTIVE Patient states that he developed dizziness and lightheadedness.  He also had floaters/squiggly lines in his vision as well as some slurred speech and confusion and felt off balance.  He states symptoms lasted about 10 to 15 minutes and then resolved not had another episode since this admitted. MRI shows no acute abnormality.  Mild changes of small vessel disease. He states he does have [facility] and has been followed up for syncope in the past. We are recommending   a loop recorder placed.  We are recommending aspirin 81 mg and Plavix 75 mg daily for 3 weeks then aspirin alone  OBJECTIVE  CBC    Component Value Date/Time   WBC 7.3 07/11/2023 0451   RBC 3.88 (L) 07/11/2023 0451   HGB 12.6 (L) 07/11/2023 0451   HGB 14.9 10/07/2021 0959   HCT 37.1 (L) 07/11/2023 0451   HCT 43.1 10/07/2021 0959   PLT 214 07/11/2023 0451   PLT 232 10/07/2021 0959   MCV 95.6 07/11/2023 0451   MCV 96 10/07/2021 0959   MCH 32.5 07/11/2023 0451   MCHC 34.0 07/11/2023 0451   RDW 11.9 07/11/2023 0451   RDW 11.5 (L) 10/07/2021 0959   LYMPHSABS 2.7 07/11/2023 0451   LYMPHSABS 2.7 11/18/2019 1616   MONOABS 0.5 07/11/2023 0451   EOSABS 0.1 07/11/2023 0451    EOSABS 0.1 11/18/2019 1616   BASOSABS 0.0 07/11/2023 0451   BASOSABS 0.0 11/18/2019 1616    BMET    Component Value Date/Time   NA 138 07/11/2023 0451   NA 139 10/07/2021 0959   K 3.5 07/11/2023 0451   CL 107 07/11/2023 0451   CO2 23 07/11/2023 0451   GLUCOSE 107 (H) 07/11/2023 0451   BUN 9 07/11/2023 0451   BUN 10 10/07/2021 0959   CREATININE 1.04 07/11/2023 0451   CALCIUM 8.6 (L) 07/11/2023 0451   EGFR 79 10/07/2021 0959   GFRNONAA >60 07/11/2023 0451    IMAGING past 24 hours MR ABDOMEN W WO CONTRAST  Result Date: 07/11/2023 CLINICAL DATA:  Liver lesion EXAM: MRI ABDOMEN WITHOUT AND WITH CONTRAST TECHNIQUE: Multiplanar multisequence MR imaging of the abdomen was performed both before and after the administration of intravenous contrast. CONTRAST:  8mL GADAVIST GADOBUTROL 1 MMOL/ML IV SOLN COMPARISON:  CT abdomen pelvis, 04/20/2023 FINDINGS: Lower chest: No acute abnormality. Hepatobiliary: No solid liver abnormality is seen. Multiple fluid signal cysts scattered throughout the liver, benign, for which no further follow-up or characterization is required. No gallstones, gallbladder wall thickening, or biliary dilatation. Pancreas: Dorsal agenesis of the pancreas. No pancreatic ductal dilatation or surrounding inflammatory changes. Spleen: Normal in size without significant abnormality. Adrenals/Urinary Tract: Adrenal glands are unremarkable. Severely atrophic right kidney. The left kidney is  normal, without obvious renal calculi, solid lesion, or hydronephrosis. Stomach/Bowel: Stomach is within normal limits. No evidence of bowel wall thickening, distention, or inflammatory changes. Vascular/Lymphatic: No significant vascular findings are present. No enlarged abdominal lymph nodes. Other: No abdominal wall hernia or abnormality. No ascites. Musculoskeletal: No acute or significant osseous findings. IMPRESSION: 1. Multiple fluid signal cysts scattered throughout the liver, benign, for which  no further follow-up or characterization is required. No solid liver lesion. 2. 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 3. Severely atrophic right kidney.  No hydronephrosis. Electronically Signed   By: Jearld Lesch M.D.   On: 07/11/2023 12:54   CT ANGIO HEAD NECK W WO CM  Result Date: 07/10/2023 CLINICAL DATA:  Cerebral aneurysm screening, high risk; near syncope EXAM: CT ANGIOGRAPHY HEAD AND NECK WITH AND WITHOUT CONTRAST TECHNIQUE: Multidetector CT imaging of the head and neck was performed using the standard protocol during bolus administration of intravenous contrast. Multiplanar CT image reconstructions and MIPs were obtained to evaluate the vascular anatomy. Carotid stenosis measurements (when applicable) are obtained utilizing NASCET criteria, using the distal internal carotid diameter as the denominator. RADIATION DOSE REDUCTION: This exam was performed according to the departmental dose-optimization program which includes automated exposure control, adjustment of the mA and/or kV according to patient size and/or use of iterative reconstruction technique. CONTRAST:  75mL OMNIPAQUE IOHEXOL 350 MG/ML SOLN COMPARISON:  07/10/2023 CT head, no prior CTA FINDINGS: CT HEAD FINDINGS For noncontrast findings, please see same day CT head. CTA NECK FINDINGS Aortic arch: Standard branching. Imaged portion shows no evidence of aneurysm or dissection. No significant stenosis of the major arch vessel origins. Right carotid system: No evidence of dissection, occlusion, or hemodynamically significant stenosis (greater than 50%). Left carotid system: No evidence of dissection, occlusion, or hemodynamically significant stenosis (greater than 50%). Vertebral arteries: No evidence of dissection, occlusion, or hemodynamically significant stenosis (greater than 50%). Skeleton: No acute osseous abnormality. Degenerative changes in the cervical spine. Other neck: No acute finding. Upper chest: Bronchial wall  thickening. A few ground-glass opacities are noted in the right upper lobe (series 7, image 346 and 359). Emphysema. No pleural effusion. Review of the MIP images confirms the above findings CTA HEAD FINDINGS Anterior circulation: Both internal carotid arteries are patent to the termini, without significant stenosis. A1 segments patent. Normal anterior communicating artery. Anterior cerebral arteries are patent to their distal aspects without significant stenosis. No M1 stenosis or occlusion. MCA branches perfused to their distal aspects without significant stenosis. Posterior circulation: Vertebral arteries patent to the vertebrobasilar junction without significant stenosis. Posterior inferior cerebellar arteries patent proximally. Basilar patent to its distal aspect without significant stenosis. Superior cerebellar arteries patent proximally. Patent P1 segments. PCAs perfused to their distal aspects without significant stenosis. The bilateral posterior communicating arteries are not visualized. Venous sinuses: As permitted by contrast timing, patent. Anatomic variants: None significant. No evidence of aneurysm or vascular malformation. Review of the MIP images confirms the above findings IMPRESSION: 1. No intracranial large vessel occlusion or significant stenosis. 2. No hemodynamically significant stenosis in the neck. 3. Bronchial wall thickening with a few ground-glass opacities in the right upper lobe, which could be infectious or inflammatory. Electronically Signed   By: Wiliam Ke M.D.   On: 07/10/2023 19:28   DG Chest Port 1 View  Result Date: 07/10/2023 CLINICAL DATA:  Blurred vision, difficulty forming words, dizziness EXAM: PORTABLE CHEST 1 VIEW COMPARISON:  05/17/2023 FINDINGS: Stable cardiomediastinal silhouette. Left basilar atelectasis/scarring. Otherwise no focal consolidation,  pleural effusion, or pneumothorax. No displaced rib fractures. IMPRESSION: No acute cardiopulmonary disease.  Electronically Signed   By: Minerva Fester M.D.   On: 07/10/2023 18:53   CT Head Wo Contrast  Result Date: 07/10/2023 CLINICAL DATA:  Syncope, altered mental status EXAM: CT HEAD WITHOUT CONTRAST TECHNIQUE: Contiguous axial images were obtained from the base of the skull through the vertex without intravenous contrast. RADIATION DOSE REDUCTION: This exam was performed according to the departmental dose-optimization program which includes automated exposure control, adjustment of the mA and/or kV according to patient size and/or use of iterative reconstruction technique. COMPARISON:  None Available. FINDINGS: Brain: There is no acute intracranial hemorrhage, extra-axial fluid collection, or acute infarct. Parenchymal volume is normal. The ventricles are normal in size. Gray-white differentiation is preserved. The pituitary and suprasellar region are normal. There is no mass lesion. There is no mass effect or midline shift. Vascular: No hyperdense vessel or unexpected calcification. Skull: Normal. Negative for fracture or focal lesion. Sinuses/Orbits: The paranasal sinuses are clear. Bilateral lens implants are in place. The globes and orbits are otherwise unremarkable. Other: The mastoid air cells and middle ear cavities are clear. IMPRESSION: No acute intracranial pathology. Electronically Signed   By: Lesia Hausen M.D.   On: 07/10/2023 17:31    Vitals:   07/11/23 0349 07/11/23 0500 07/11/23 0824 07/11/23 1226  BP: 108/75  113/73 117/72  Pulse: (!) 55  (!) 48 (!) 49  Resp: 16  17 17   Temp: 97.9 F (36.6 C)  98.1 F (36.7 C) 97.9 F (36.6 C)  TempSrc: Oral  Oral Oral  SpO2: 100%  98% 98%  Weight:  86.1 kg    Height:         PHYSICAL EXAM General:  Alert, well-nourished, well-developed middle-aged Caucasian male in no acute distress Psych:  Mood and affect appropriate for situation CV: Regular rate and rhythm on monitor Respiratory:  Regular, unlabored respirations on room air GI: Abdomen  soft and nontender   NEURO:  Mental Status: AA&Ox3, patient is able to give clear and coherent history Speech/Language: speech is without dysarthria or aphasia.  Naming, repetition, fluency, and comprehension intact.  Cranial Nerves:  II: PERRL. Visual fields full.  III, IV, VI: EOMI. Eyelids elevate symmetrically.  V: Sensation is intact to light touch and symmetrical to face.  VII: Face is symmetrical resting and smiling VIII: hearing intact to voice. IX, X: Palate elevates symmetrically. Phonation is normal.  HQ:IONGEXBM shrug 5/5. XII: tongue is midline without fasciculations. Motor: 5/5 strength to all muscle groups tested.  Tone: is normal and bulk is normal Sensation- Intact to light touch bilaterally. Extinction absent to light touch to DSS.   Coordination: FTN intact bilaterally, HKS: no ataxia in BLE.No drift.  Gait- deferred  ASSESSMENT/PLAN  Posterior circulation TIA:   Etiology:    Strong suspicion of cardioembolic etiology CT head No acute abnormality.    CTA head & neck no LVO MRI pending 2D Echo pending LDL 85 HgbA1c 5.0 VTE prophylaxis -SCDs No antithrombotic prior to admission, now on aspirin 81 mg daily and clopidogrel 75 mg daily for 3 weeks and then aspirin alone. Therapy recommendations:  Pending Disposition: Pending  Hypertension Home meds: None Stable Blood Pressure Goal: SBP less than 160   Hyperlipidemia Home meds: None,  LDL 85, goal < 70 Add Crestor 20 mg Continue statin at discharge  Dysphagia Patient has post-stroke dysphagia, SLP consulted    Diet   Diet regular Room service appropriate? Yes; Fluid  consistency: Thin   Advance diet as tolerated  Other Stroke Risk Factors ETOH use,, advised to drink no more than 2 drink(s) a day   Other Active Problems   Hospital day # 0  Gevena Mart DNP, ACNPC-AG  [facility]  I have personally obtained history,examined this patient, reviewed notes, independently viewed  imaging studies, participated in medical decision making and plan of care.ROS completed by me personally and pertinent positives fully documented  I have made any additions or clarifications directly to the above note. Agree with note above.  Patient presented with sudden onset of transient vision disturbance, gait ataxia and slurred speech likely posterior circulation TIA.  MRI is negative for acute stroke and neurovascular imaging shows no large vessel stenosis or occlusion.  Strong suspicion for cardiac arrhythmia given prior history of palpitations.  Recommend loop recorder for paroxysmal A-fib.  Aspirin and Plavix for 3 weeks followed by aspirin alone.  Aggressive risk factor modification.  Long discussion patient and family at the bedside and answered questions.  Discussed with the EP team.  Discussed with Dr. Caleb Popp..  Greater than 50% time during this 50-minute visit was spent on counseling and coordination of care about his TIA and suspected cardiac arrhythmias discussion about evaluation and treatment and answering questions.  Follow-up as an outpatient stroke clinic in 2 months Delia Heady, MD Medical Director [facility] Pager: [phone] 07/11/2023 2:34 PM   To contact Stroke Continuity provider, please refer to [email or web address]. After hours, contact General Neurology

## 2023-07-11 NOTE — Plan of Care (Signed)
  Problem: Education: Goal: Knowledge of disease or condition will improve Outcome: Progressing Goal: Knowledge of secondary prevention will improve (MUST DOCUMENT ALL) Outcome: Progressing   Problem: Ischemic Stroke/TIA Tissue Perfusion: Goal: Complications of ischemic stroke/TIA will be minimized Outcome: Progressing   Problem: Coping: Goal: Will verbalize positive feelings about self Outcome: Progressing

## 2023-07-11 NOTE — Hospital Course (Signed)
Edward Hoover Edward Hoover. is a 62 y.o. male with a history of BPH, depression.  Patient presented secondary to blurry vision with concern for TIA. Stroke workup was negative. Neurology recommendation for dual antiplatelet therapy and lipid-lowering management. Loop recorder placed prior to discharge. No therapy follow-up.

## 2023-07-11 NOTE — Progress Notes (Signed)
SLP Cancellation Note  Patient Details Name: Edward Hoover. MRN: [id number] DOB: 1961/05/10   Cancelled treatment:       Reason Eval/Treat Not Completed: SLP screened, no needs identified, will sign off   Blenda Mounts Laurice 07/11/2023, 10:11 AM

## 2023-07-11 NOTE — Progress Notes (Signed)
Echocardiogram 2D Echocardiogram has been performed.  Casimiro Needle P Jontae Adebayo 07/11/2023, 11:01 AM

## 2023-07-11 NOTE — Evaluation (Signed)
Occupational Therapy Evaluation Patient Details Name: Edward Hoover. MRN: [id number] DOB: [birth year] Today's Date: 07/11/2023   History of Present Illness Pt is a 62 y/o M presenting to ED on 11/5 with blurred vision, dizziness, and near syncopal episode at work. CTH negative for intracranial pathology. PMH includes GERD, depression, asthma, diverticulitis.   Clinical Impression   Pt reports ind at baseline with ADLs and functional mobility, lives with spouse/family who can assist at d/c. Pt currently at baseline performing hallway mobility and simulated ADLs without assist. Denies dizziness and visual changes at time of evaluation.  Pt presenting with impairments listed below, however has no acute OT needs at this time. Will s/o. Please reconsult if there is a change in pt status.       If plan is discharge home, recommend the following: Assistance with cooking/housework    Functional Status Assessment  Patient has had a recent decline in their functional status and demonstrates the ability to make significant improvements in function in a reasonable and predictable amount of time.  Equipment Recommendations  None recommended by OT    Recommendations for Other Services       Precautions / Restrictions Precautions Precautions: Fall Restrictions Weight Bearing Restrictions: No      Mobility Bed Mobility Overal bed mobility: Independent                  Transfers Overall transfer level: Independent                        Balance Overall balance assessment: No apparent balance deficits (not formally assessed)                                         ADL either performed or assessed with clinical judgement   ADL Overall ADL's : At baseline                                       General ADL Comments: performing simulated ADLs and mobility without assist     Vision   Vision Assessment?: Yes Eye Alignment:  Within Functional Limits Ocular Range of Motion: Within Functional Limits Alignment/Gaze Preference: Within Defined Limits Tracking/Visual Pursuits: Able to track stimulus in all quads without difficulty Visual Fields: No apparent deficits Additional Comments: able to read short paragraph without difficulty     Perception Perception: Not tested       Praxis Praxis: Not tested       Pertinent Vitals/Pain Pain Assessment Pain Assessment: No/denies pain     Extremity/Trunk Assessment Upper Extremity Assessment Upper Extremity Assessment: Overall WFL for tasks assessed   Lower Extremity Assessment Lower Extremity Assessment: Overall WFL for tasks assessed   Cervical / Trunk Assessment Cervical / Trunk Assessment: Normal   Communication Communication Communication: No apparent difficulties   Cognition Arousal: Alert Behavior During Therapy: WFL for tasks assessed/performed Overall Cognitive Status: Within Functional Limits for tasks assessed                                       General Comments  VSS    Exercises     Shoulder Instructions      Home Living Family/patient expects  to be discharged to:: Private residence Living Arrangements: Spouse/significant other;Children;Other relatives Available Help at Discharge: Family;Available 24 hours/day Type of Home: House Home Access: Stairs to enter [employer] of Steps: 12   Home Layout: Two level;Able to live on main level with bedroom/bathroom     Bathroom Shower/Tub: Walk-in shower         Home Equipment: Grab bars - toilet          Prior Functioning/Environment Prior Level of Function : Independent/Modified Independent;Driving;Working/employed             Mobility Comments: ind; has been fatigued since coming to the hospital for diverticulitis a little while ago ADLs Comments: ind        OT Problem List:        OT Treatment/Interventions:      OT Goals(Current  goals can be found in the care plan section) Acute Rehab OT Goals Patient Stated Goal: none stated OT Goal Formulation: With patient Time For Goal Achievement: 07/25/23 Potential to Achieve Goals: Good  OT Frequency:      Co-evaluation              AM-PAC OT "6 Clicks" Daily Activity     Outcome Measure Help from another person eating meals?: None Help from another person taking care of personal grooming?: None Help from another person toileting, which includes using toliet, bedpan, or urinal?: None Help from another person bathing (including washing, rinsing, drying)?: None Help from another person to put on and taking off regular upper body clothing?: None Help from another person to put on and taking off regular lower body clothing?: None 6 Click Score: 24   End of Session Nurse Communication: Mobility status  Activity Tolerance: Patient tolerated treatment well Patient left: in bed;with call bell/phone within reach;with family/visitor present  OT Visit Diagnosis: Muscle weakness (generalized) (M62.81)                Time: 1610-9604 OT Time Calculation (min): 14 min Charges:  OT General Charges $OT Visit: 1 Visit OT Evaluation $OT Eval Low Complexity: 1 Low  Arville Postlewaite K, OTD, OTR/L SecureChat Preferred Acute Rehab [phone]   Waynette Towers K Koonce 07/11/2023, 8:15 AM

## 2023-07-11 NOTE — Progress Notes (Signed)
PT Cancellation Note  Patient Details Name: Edward Hoover. MRN: [id number] DOB: [birth year]   Cancelled Treatment:    Reason Eval/Treat Not Completed: PT screened, no needs identified, will sign off PT orders received, chart reviewed. Per OT, pt is independent with mobility & pt confirms this as well. PT to complete current orders at this time, please re-consult if new needs arise.  Aleda Grana, PT, DPT 07/11/23, 9:11 AM   Sandi Mariscal 07/11/2023, 9:10 AM

## 2023-07-11 NOTE — Consult Note (Signed)
ELECTROPHYSIOLOGY CONSULT NOTE  Patient ID: Edward Hoover. MRN: [id number], DOB/AGE: [birth year]   Admit date: 07/10/2023 Date of Consult: 07/11/2023  Primary Physician: Verlee Rossetti, PA-C Primary Cardiologist: None  Primary Electrophysiologist: New to None  Reason for Consultation: Cryptogenic stroke; recommendations regarding Implantable Loop Recorder Insurance: [employer]  History of Present Illness: 62 y/o M with PMH of tobacco abuse, asthma, GERD EP has been asked to evaluate Edward Hoover. for placement of an implantable loop recorder to monitor for atrial fibrillation by Dr Pearlean Brownie.  The patient was admitted on 07/10/2023 with lightheadedness nearly passing out.  Admitted for concern for ischemic infarct vs TIA.   Imaging demonstrated negative CT Head and MRI Brain for LVO.    He has undergone workup for stroke including:   CT head No acute abnormality.    CTA head & neck no LVO MRI pending 2D Echo pending LDL 85 HgbA1c 5.0 No antithrombotic prior to admission, now on aspirin 81 mg daily and clopidogrel 75 mg daily for 3 weeks and then aspirin alone.   The patient has been monitored on telemetry which has demonstrated sinus rhythm with no arrhythmias.  Inpatient stroke work-up will not require a TEE per Neurology.   Echocardiogram as above. Lab work is reviewed.  Prior to admission, the patient denies chest pain, shortness of breath, dizziness, palpitations, or syncope.  He is recovering from his stroke with plans to return home  at discharge.  Allergies, Past Medical, Surgical, Social, and Family Histories have been reviewed and are referenced here-in when relevant for medical decision making.   Inpatient Medications:   ARIPiprazole  10 mg Oral Daily   aspirin  81 mg Oral Daily   clopidogrel  75 mg Oral Daily   loratadine  10 mg Oral Daily   pantoprazole  40 mg Oral Daily   rosuvastatin  20 mg Oral Daily   tamsulosin  0.4 mg Oral QPC supper     Physical Exam: Vitals:   07/11/23 0349 07/11/23 0500 07/11/23 0824 07/11/23 1226  BP: 108/75  113/73 117/72  Pulse: (!) 55  (!) 48 (!) 49  Resp: 16  17 17   Temp: 97.9 F (36.6 C)  98.1 F (36.7 C) 97.9 F (36.6 C)  TempSrc: Oral  Oral Oral  SpO2: 100%  98% 98%  Weight:  86.1 kg    Height:        GEN- NAD. A&O x 3. Normal affect. HEENT: Normocephalic, atraumatic Lungs- CTAB, Normal effort.  Heart- Regular rate and rhythm rate and rhythm. No M/G/R.  Extremities- No peripheral edema. no clubbing or cyanosis Skin- warm and dry, no rash or lesion. Neuro - AAOx4   12-lead ECG 07/10/23 SB  (personally reviewed) All prior EKG's in EPIC reviewed with no documented atrial fibrillation  Telemetry Sinus arrhythmia, SR, occ PVC's, one episode of vagal mediated appearing sinus bradycardia (personally reviewed)  Assessment and Plan:  Possible TIA vs Ischemic Infarct  Hx of Near Syncope  The patient presents with cryptogenic stroke.  The patient does not have a TEE planned for this AM.  I spoke at length with the patient about monitoring for afib with an implantable loop recorder.  Risks, benefits, and alteratives to implantable loop recorder were discussed with the patient today.   At this time, the patient is very clear in their decision to proceed with implantable loop recorder.   Wound care was reviewed with the patient (keep incision clean and dry for 3  days). Please call with questions.    Canary Brim, MSN, APRN, NP-C, AGACNP-BC [facility] - Electrophysiology  07/11/2023, 1:55 PM

## 2023-07-11 NOTE — Plan of Care (Signed)
  Problem: Education: Goal: Knowledge of disease or condition will improve Outcome: Adequate for Discharge Goal: Knowledge of secondary prevention will improve (MUST DOCUMENT ALL) Outcome: Adequate for Discharge Goal: Knowledge of patient specific risk factors will improve Loraine Leriche N/A or DELETE if not current risk factor) Outcome: Adequate for Discharge   Problem: Ischemic Stroke/TIA Tissue Perfusion: Goal: Complications of ischemic stroke/TIA will be minimized Outcome: Adequate for Discharge   Problem: Coping: Goal: Will verbalize positive feelings about self Outcome: Adequate for Discharge Goal: Will identify appropriate support needs Outcome: Adequate for Discharge   Problem: Health Behavior/Discharge Planning: Goal: Ability to manage health-related needs will improve Outcome: Adequate for Discharge Goal: Goals will be collaboratively established with patient/family Outcome: Adequate for Discharge   Problem: Self-Care: Goal: Ability to participate in self-care as condition permits will improve Outcome: Adequate for Discharge Goal: Verbalization of feelings and concerns over difficulty with self-care will improve Outcome: Adequate for Discharge Goal: Ability to communicate needs accurately will improve Outcome: Adequate for Discharge   Problem: Nutrition: Goal: Risk of aspiration will decrease Outcome: Adequate for Discharge Goal: Dietary intake will improve Outcome: Adequate for Discharge   Problem: Education: Goal: Knowledge of General Education information will improve Description: Including pain rating scale, medication(s)/side effects and non-pharmacologic comfort measures Outcome: Adequate for Discharge   Problem: Health Behavior/Discharge Planning: Goal: Ability to manage health-related needs will improve Outcome: Adequate for Discharge   Problem: Clinical Measurements: Goal: Ability to maintain clinical measurements within normal limits will improve Outcome:  Adequate for Discharge Goal: Will remain free from infection Outcome: Adequate for Discharge Goal: Diagnostic test results will improve Outcome: Adequate for Discharge Goal: Respiratory complications will improve Outcome: Adequate for Discharge Goal: Cardiovascular complication will be avoided Outcome: Adequate for Discharge   Problem: Activity: Goal: Risk for activity intolerance will decrease Outcome: Adequate for Discharge   Problem: Nutrition: Goal: Adequate nutrition will be maintained Outcome: Adequate for Discharge   Problem: Coping: Goal: Level of anxiety will decrease Outcome: Adequate for Discharge   Problem: Elimination: Goal: Will not experience complications related to bowel motility Outcome: Adequate for Discharge Goal: Will not experience complications related to urinary retention Outcome: Adequate for Discharge   Problem: Pain Management: Goal: General experience of comfort will improve Outcome: Adequate for Discharge   Problem: Safety: Goal: Ability to remain free from injury will improve Outcome: Adequate for Discharge   Problem: Skin Integrity: Goal: Risk for impaired skin integrity will decrease Outcome: Adequate for Discharge

## 2023-07-11 NOTE — Discharge Instructions (Signed)
Edward Hoover Andrez Grime.,  You were in the hospital with vision loss which appears to be likely related to a TIA (transient ischemic attack) which can be a precursor to a stroke. The neurologist has recommended aspirin and Plavix, however you have declined aspirin at this time. The neurologist has also recommended Crestor or Zetia which you have also declined at this time. Before you left, you had a loop recorder placed and you can follow-up with your primary cardiologist. Please follow-up with neurology as an outpatient.

## 2023-07-11 NOTE — TOC CM/SW Note (Signed)
Transition of Care [facility]) - Inpatient Brief Assessment   Patient Details  Name: Edward Hoover. MRN: [id number] Date of Birth: [birth year]  [facility] CM/SW Contact:    Kermit Balo, RN Phone Number: 07/11/2023, 2:41 PM   Clinical Narrative:  No follow up per PT/OT.  Transition of Care Asessment: Insurance and Status: Insurance coverage has been reviewed Patient has primary care physician: Yes Home environment has been reviewed: home with spouse   Prior/Current Home Services: No current home services Social Determinants of Health Reivew: SDOH reviewed no interventions necessary Readmission risk has been reviewed: Yes Transition of care needs: no transition of care needs at this time

## 2023-07-12 ENCOUNTER — Encounter (HOSPITAL_COMMUNITY): Payer: Self-pay | Admitting: Cardiology

## 2023-07-12 SURGERY — MRI WITH ANESTHESIA
Anesthesia: General

## 2023-07-16 ENCOUNTER — Ambulatory Visit (INDEPENDENT_AMBULATORY_CARE_PROVIDER_SITE_OTHER)

## 2023-07-16 DIAGNOSIS — R55 Syncope and collapse: Secondary | ICD-10-CM

## 2023-07-30 ENCOUNTER — Telehealth: Payer: Self-pay | Admitting: Cardiology

## 2023-07-30 NOTE — Telephone Encounter (Signed)
Patient's spouse Scarlette Calico [facility]) calling in stating that she is very concerned for patient as his boss called her today and said that patient was not acting like himself. She states his boss said "He is making a lot of mistakes but doesn't seem to be aware of it". Spouse Scarlette Calico states that on Saturday he also had an episode of slurred speech which resolved after a nap. Patient recently admitted to ED 07/12/23 for TIA and had loop recorder placed by Dr. Jimmey Ralph. Patient is now home from work and Scarlette Calico is asking what steps to take next.   After researching in chart, it appears that patient has not been seen by Dr. Allyson Sabal since 10/07/21 and has only seen Dr. Jimmey Ralph in ED. Patient's spouse confirms that patient was being seen most recently by [facility] cardiologist Dr. Delton Coombes.   Laray Anger to take patient to ED or call 911 as patient may be having a TIA or a stroke. Spouse verbalized understanding.

## 2023-07-30 NOTE — Telephone Encounter (Signed)
Pt spouse called in stating pt "seems out of it". She states he is very fatigue. She denies he is having SOB or CP.

## 2023-08-08 NOTE — Progress Notes (Signed)
[place] Loop Recorder 

## 2023-08-15 LAB — CUP PACEART REMOTE DEVICE CHECK
Date Time Interrogation Session: 20241209010100
Implantable Pulse Generator Implant Date: 20241106
Pulse Gen Serial Number: 123697

## 2023-08-16 ENCOUNTER — Ambulatory Visit

## 2023-08-16 DIAGNOSIS — R55 Syncope and collapse: Secondary | ICD-10-CM

## 2023-08-20 ENCOUNTER — Other Ambulatory Visit (HOSPITAL_COMMUNITY): Payer: Self-pay

## 2023-09-11 ENCOUNTER — Inpatient Hospital Stay: Admitting: Neurology

## 2023-09-17 ENCOUNTER — Ambulatory Visit (INDEPENDENT_AMBULATORY_CARE_PROVIDER_SITE_OTHER)

## 2023-09-17 DIAGNOSIS — R55 Syncope and collapse: Secondary | ICD-10-CM | POA: Diagnosis not present

## 2023-09-17 LAB — CUP PACEART REMOTE DEVICE CHECK
Date Time Interrogation Session: 20250113010700
Implantable Pulse Generator Implant Date: 20241106
Pulse Gen Serial Number: 123697

## 2023-09-20 NOTE — Progress Notes (Signed)
[facility] Summary Report / Loop Recorder 

## 2023-10-18 ENCOUNTER — Ambulatory Visit (INDEPENDENT_AMBULATORY_CARE_PROVIDER_SITE_OTHER)

## 2023-10-18 DIAGNOSIS — R55 Syncope and collapse: Secondary | ICD-10-CM

## 2023-10-19 LAB — CUP PACEART REMOTE DEVICE CHECK
Date Time Interrogation Session: 20250213021100
Implantable Pulse Generator Implant Date: 20241106
Pulse Gen Serial Number: 123697

## 2023-10-25 NOTE — Progress Notes (Signed)
 Clarity Loop Recorder

## 2023-11-19 ENCOUNTER — Ambulatory Visit (INDEPENDENT_AMBULATORY_CARE_PROVIDER_SITE_OTHER)

## 2023-11-19 DIAGNOSIS — R55 Syncope and collapse: Secondary | ICD-10-CM | POA: Diagnosis not present

## 2023-11-19 LAB — CUP PACEART REMOTE DEVICE CHECK
Date Time Interrogation Session: 20250317001600
Implantable Pulse Generator Implant Date: 20241106
Pulse Gen Serial Number: 123697

## 2023-11-21 NOTE — Addendum Note (Signed)
 Addended by: Elease Etienne A on: 11/21/2023 10:01 AM   Modules accepted: Orders

## 2023-11-21 NOTE — Progress Notes (Signed)
 Carelink Summary Report / Loop Recorder

## 2023-12-20 ENCOUNTER — Ambulatory Visit (INDEPENDENT_AMBULATORY_CARE_PROVIDER_SITE_OTHER)

## 2023-12-20 DIAGNOSIS — R55 Syncope and collapse: Secondary | ICD-10-CM

## 2023-12-20 LAB — CUP PACEART REMOTE DEVICE CHECK
Date Time Interrogation Session: 20250417022000
Implantable Pulse Generator Implant Date: 20241106
Pulse Gen Serial Number: 123697

## 2024-01-01 NOTE — Progress Notes (Signed)
Bsx loop recorder 

## 2024-01-21 ENCOUNTER — Ambulatory Visit (INDEPENDENT_AMBULATORY_CARE_PROVIDER_SITE_OTHER)

## 2024-01-21 DIAGNOSIS — R55 Syncope and collapse: Secondary | ICD-10-CM

## 2024-01-21 LAB — CUP PACEART REMOTE DEVICE CHECK
Date Time Interrogation Session: 20250519002400
Implantable Pulse Generator Implant Date: 20241106
Pulse Gen Serial Number: 123697

## 2024-01-23 ENCOUNTER — Ambulatory Visit: Payer: Self-pay | Admitting: Cardiology

## 2024-01-30 NOTE — Progress Notes (Signed)
 Carelink Summary Report / Loop Recorder

## 2024-02-21 ENCOUNTER — Ambulatory Visit (INDEPENDENT_AMBULATORY_CARE_PROVIDER_SITE_OTHER)

## 2024-02-21 DIAGNOSIS — R55 Syncope and collapse: Secondary | ICD-10-CM | POA: Diagnosis not present

## 2024-02-21 LAB — CUP PACEART REMOTE DEVICE CHECK
Date Time Interrogation Session: 20250619002800
Implantable Pulse Generator Implant Date: 20241106
Pulse Gen Serial Number: 123697

## 2024-02-24 ENCOUNTER — Ambulatory Visit: Payer: Self-pay | Admitting: Cardiology

## 2024-02-24 DIAGNOSIS — R55 Syncope and collapse: Secondary | ICD-10-CM

## 2024-02-24 DIAGNOSIS — G459 Transient cerebral ischemic attack, unspecified: Secondary | ICD-10-CM

## 2024-02-26 ENCOUNTER — Ambulatory Visit: Attending: Cardiology

## 2024-02-26 DIAGNOSIS — G459 Transient cerebral ischemic attack, unspecified: Secondary | ICD-10-CM

## 2024-02-26 DIAGNOSIS — R55 Syncope and collapse: Secondary | ICD-10-CM

## 2024-02-26 NOTE — Progress Notes (Unsigned)
 Enrolled for [employer] to mail a ZIO XT long term holter monitor to the patients address on file.

## 2024-02-26 NOTE — Telephone Encounter (Signed)
 The patient has been notified of the result and verbalized understanding.  All questions (if any) were answered. Allean Montfort Chauvigne, RN 02/26/2024 8:45 AM  Heart monitor has been ordered.

## 2024-03-05 NOTE — Progress Notes (Signed)
 Bsx Loop Recorder

## 2024-03-24 ENCOUNTER — Ambulatory Visit: Payer: Self-pay

## 2024-03-24 DIAGNOSIS — R55 Syncope and collapse: Secondary | ICD-10-CM | POA: Diagnosis not present

## 2024-03-25 LAB — CUP PACEART REMOTE DEVICE CHECK
Date Time Interrogation Session: 20250721003300
Implantable Pulse Generator Implant Date: 20241106
Pulse Gen Serial Number: 123697

## 2024-03-26 ENCOUNTER — Ambulatory Visit: Payer: Self-pay | Admitting: Cardiology

## 2024-03-31 ENCOUNTER — Telehealth: Payer: Self-pay

## 2024-03-31 DIAGNOSIS — R55 Syncope and collapse: Secondary | ICD-10-CM

## 2024-03-31 DIAGNOSIS — G459 Transient cerebral ischemic attack, unspecified: Secondary | ICD-10-CM | POA: Diagnosis not present

## 2024-03-31 NOTE — Telephone Encounter (Signed)
 Alert remote transmission: Tachy 1 new tachy event, 11sec in duration HR 180, NCT with some oversensing of noise   ____________________________________________________________________  [place] and spoke with patient regarding new tachy alert on 7/26. Patient asymptomatic during this time. States compliance with medications. Advised that we will continue to monitor and if any symptoms arise in the future to notify the device clinic. Patient appreciative for call.   Device clinic number given to patient and informed to call for any further questions or concerns.

## 2024-04-22 NOTE — Progress Notes (Signed)
 Carelink Summary Report / Loop Recorder

## 2024-04-24 ENCOUNTER — Ambulatory Visit (INDEPENDENT_AMBULATORY_CARE_PROVIDER_SITE_OTHER): Payer: Self-pay

## 2024-04-24 DIAGNOSIS — R55 Syncope and collapse: Secondary | ICD-10-CM

## 2024-04-24 LAB — CUP PACEART REMOTE DEVICE CHECK
Date Time Interrogation Session: 20250821013700
Implantable Pulse Generator Implant Date: 20241106
Pulse Gen Serial Number: 123697

## 2024-05-11 ENCOUNTER — Ambulatory Visit: Payer: Self-pay | Admitting: Cardiology

## 2024-05-26 ENCOUNTER — Ambulatory Visit (INDEPENDENT_AMBULATORY_CARE_PROVIDER_SITE_OTHER): Payer: Self-pay

## 2024-05-26 DIAGNOSIS — G459 Transient cerebral ischemic attack, unspecified: Secondary | ICD-10-CM | POA: Diagnosis not present

## 2024-05-26 LAB — CUP PACEART REMOTE DEVICE CHECK
Date Time Interrogation Session: 20250920124100
Implantable Pulse Generator Implant Date: 20241106
Pulse Gen Serial Number: 123697

## 2024-05-27 NOTE — Progress Notes (Signed)
 Remote Loop Recorder Transmission

## 2024-05-31 ENCOUNTER — Ambulatory Visit: Payer: Self-pay | Admitting: Cardiology

## 2024-06-09 NOTE — Progress Notes (Signed)
 Remote Loop Recorder Transmission

## 2024-06-26 ENCOUNTER — Ambulatory Visit (INDEPENDENT_AMBULATORY_CARE_PROVIDER_SITE_OTHER): Payer: Self-pay

## 2024-06-26 DIAGNOSIS — G459 Transient cerebral ischemic attack, unspecified: Secondary | ICD-10-CM

## 2024-06-26 LAB — CUP PACEART REMOTE DEVICE CHECK
Date Time Interrogation Session: 20251023004600
Implantable Pulse Generator Implant Date: 20241106
Pulse Gen Serial Number: 123697

## 2024-06-27 NOTE — Progress Notes (Signed)
 Remote Loop Recorder Transmission

## 2024-06-28 ENCOUNTER — Ambulatory Visit: Payer: Self-pay | Admitting: Cardiology

## 2024-07-27 ENCOUNTER — Ambulatory Visit: Payer: Self-pay

## 2024-07-27 DIAGNOSIS — G459 Transient cerebral ischemic attack, unspecified: Secondary | ICD-10-CM | POA: Diagnosis not present

## 2024-07-28 ENCOUNTER — Ambulatory Visit: Payer: Self-pay | Admitting: Cardiology

## 2024-07-28 LAB — CUP PACEART REMOTE DEVICE CHECK
Date Time Interrogation Session: 20251123025100
Implantable Pulse Generator Implant Date: 20241106
Pulse Gen Serial Number: 123697

## 2024-07-29 NOTE — Progress Notes (Signed)
 Remote Loop Recorder Transmission

## 2024-08-28 ENCOUNTER — Ambulatory Visit (INDEPENDENT_AMBULATORY_CARE_PROVIDER_SITE_OTHER): Payer: Self-pay

## 2024-08-28 DIAGNOSIS — G459 Transient cerebral ischemic attack, unspecified: Secondary | ICD-10-CM | POA: Diagnosis not present

## 2024-08-28 LAB — CUP PACEART REMOTE DEVICE CHECK
Date Time Interrogation Session: 20251224005600
Implantable Pulse Generator Implant Date: 20241106
Pulse Gen Serial Number: 123697

## 2024-08-29 NOTE — Progress Notes (Signed)
 Remote Loop Recorder Transmission

## 2024-09-07 ENCOUNTER — Ambulatory Visit: Payer: Self-pay | Admitting: Cardiology

## 2024-09-28 ENCOUNTER — Ambulatory Visit: Attending: Cardiology

## 2024-09-28 DIAGNOSIS — G459 Transient cerebral ischemic attack, unspecified: Secondary | ICD-10-CM | POA: Diagnosis not present

## 2024-09-29 ENCOUNTER — Ambulatory Visit: Payer: Self-pay | Admitting: Cardiology

## 2024-09-29 LAB — CUP PACEART REMOTE DEVICE CHECK
Date Time Interrogation Session: 20260125120100
Implantable Pulse Generator Implant Date: 20241106
Pulse Gen Serial Number: 123697

## 2024-10-02 NOTE — Progress Notes (Signed)
 Remote Loop Recorder Transmission

## 2024-10-29 ENCOUNTER — Ambulatory Visit
